# Patient Record
Sex: Male | Born: 1955
Health system: Southern US, Community
[De-identification: ages and names within clinical notes are randomized; demographics above are authoritative.]

## PROBLEM LIST (undated history)

## (undated) DIAGNOSIS — M1990 Unspecified osteoarthritis, unspecified site: Secondary | ICD-10-CM

## (undated) DIAGNOSIS — E7800 Pure hypercholesterolemia, unspecified: Secondary | ICD-10-CM

## (undated) DIAGNOSIS — F419 Anxiety disorder, unspecified: Secondary | ICD-10-CM

## (undated) DIAGNOSIS — K219 Gastro-esophageal reflux disease without esophagitis: Secondary | ICD-10-CM

## (undated) DIAGNOSIS — F32A Depression, unspecified: Secondary | ICD-10-CM

## (undated) DIAGNOSIS — F1910 Other psychoactive substance abuse, uncomplicated: Secondary | ICD-10-CM

## (undated) DIAGNOSIS — J189 Pneumonia, unspecified organism: Secondary | ICD-10-CM

## (undated) DIAGNOSIS — F329 Major depressive disorder, single episode, unspecified: Secondary | ICD-10-CM

## (undated) DIAGNOSIS — G709 Myoneural disorder, unspecified: Secondary | ICD-10-CM

## (undated) HISTORY — PX: COLONOSCOPY, ESOPHAGOGASTRODUODENOSCOPY (EGD) AND ESOPHAGEAL DILATION: SHX5781

## (undated) HISTORY — PX: TONSILLECTOMY: SUR1361

## (undated) HISTORY — PX: CARPAL TUNNEL RELEASE: SHX101

---

## 1991-01-28 HISTORY — PX: POSTERIOR LAMINECTOMY / DECOMPRESSION LUMBAR SPINE: SUR740

## 1996-01-28 HISTORY — PX: BACK SURGERY: SHX140

## 2006-01-13 ENCOUNTER — Emergency Department (HOSPITAL_COMMUNITY): Admission: EM | Admit: 2006-01-13 | Discharge: 2006-01-13 | Payer: Self-pay | Admitting: Emergency Medicine

## 2006-10-29 ENCOUNTER — Ambulatory Visit: Payer: Self-pay | Admitting: Family Medicine

## 2008-02-07 DIAGNOSIS — L858 Other specified epidermal thickening: Secondary | ICD-10-CM

## 2008-02-07 HISTORY — DX: Other specified epidermal thickening: L85.8

## 2008-09-06 ENCOUNTER — Ambulatory Visit: Payer: Self-pay | Admitting: Unknown Physician Specialty

## 2008-09-15 ENCOUNTER — Ambulatory Visit: Payer: Self-pay | Admitting: Unknown Physician Specialty

## 2008-09-19 ENCOUNTER — Inpatient Hospital Stay: Payer: Self-pay | Admitting: Unknown Physician Specialty

## 2009-03-22 ENCOUNTER — Ambulatory Visit: Payer: Self-pay | Admitting: Gastroenterology

## 2010-02-18 ENCOUNTER — Ambulatory Visit: Payer: Self-pay | Admitting: Gastroenterology

## 2010-02-21 LAB — PATHOLOGY REPORT

## 2014-02-27 ENCOUNTER — Ambulatory Visit: Payer: Self-pay | Admitting: Surgery

## 2014-02-27 LAB — URINALYSIS, COMPLETE
BILIRUBIN, UR: NEGATIVE
Bacteria: NONE SEEN
Blood: NEGATIVE
Glucose,UR: NEGATIVE mg/dL (ref 0–75)
KETONE: NEGATIVE
Leukocyte Esterase: NEGATIVE
Nitrite: NEGATIVE
PH: 6 (ref 4.5–8.0)
Protein: NEGATIVE
RBC, UR: NONE SEEN /HPF (ref 0–5)
SPECIFIC GRAVITY: 1.02 (ref 1.003–1.030)
WBC UR: <1 /HPF (ref 0–5)

## 2014-02-27 LAB — CBC
HCT: 50.4 % (ref 40.0–52.0)
HGB: 16.7 g/dL (ref 13.0–18.0)
MCH: 32.5 pg (ref 26.0–34.0)
MCHC: 33 g/dL (ref 32.0–36.0)
MCV: 98 fL (ref 80–100)
PLATELETS: 236 10*3/uL (ref 150–440)
RBC: 5.12 10*6/uL (ref 4.40–5.90)
RDW: 13.1 % (ref 11.5–14.5)
WBC: 7.8 10*3/uL (ref 3.8–10.6)

## 2014-02-27 LAB — BASIC METABOLIC PANEL
Anion Gap: 8 (ref 7–16)
BUN: 14 mg/dL (ref 7–18)
CALCIUM: 9.2 mg/dL (ref 8.5–10.1)
CO2: 24 mmol/L (ref 21–32)
Chloride: 109 mmol/L — ABNORMAL HIGH (ref 98–107)
Creatinine: 0.87 mg/dL (ref 0.60–1.30)
EGFR (African American): >60
Glucose: 86 mg/dL (ref 65–99)
Osmolality: 281 (ref 275–301)
POTASSIUM: 4.1 mmol/L (ref 3.5–5.1)
SODIUM: 141 mmol/L (ref 136–145)

## 2014-02-27 LAB — SEDIMENTATION RATE: Erythrocyte Sed Rate: 2 mm/hr (ref 0–20)

## 2014-02-27 LAB — PROTIME-INR
INR: 0.9
Prothrombin Time: 12.8 secs

## 2014-02-27 LAB — MRSA PCR SCREENING

## 2014-02-27 LAB — APTT: Activated PTT: 25.3 secs (ref 23.6–35.9)

## 2014-03-09 ENCOUNTER — Inpatient Hospital Stay: Payer: Self-pay | Admitting: Surgery

## 2014-05-28 NOTE — Op Note (Signed)
PATIENT NAME:  JAMAN, ARO MR#:  [id number] DATE OF BIRTH:  1955/05/08  DATE OF PROCEDURE:  03/09/2014  PREOPERATIVE DIAGNOSIS: Degenerative joint disease left knee, primarily involving the medial compartment.   POSTOPERATIVE DIAGNOSIS: Degenerative joint disease left knee, primarily involving the medial compartment.     PROCEDURE: Left unicondylar knee arthroplasty using an all-cemented Biomet Oxford system with a medium-sized femoral component, a "C" sized tibial tray, and a 4 mm meniscal bearing insert.   SURGEON:  Maryagnes Amos, MD.  ASSISTANT: April Berndt, NP.   ANESTHESIA: Spinal.   FINDINGS: As noted above.   COMPLICATIONS: None.   ESTIMATED BLOOD LOSS: Less than 25 mL   TOTAL FLUIDS: 1600 mL.   URINE OUTPUT: 250 mL   TOURNIQUET TIME: 80 minutes at 300 mmHg.   DRAINS: None.   CLOSURE: Staples.   BRIEF CLINICAL NOTE: The patient is a 59 year old male who has had history of gradually worsening medial-sided left knee pain. His symptoms have persisted despite medications, activity modification, et Karie Soda. His history and examination are consistent with moderate to severe degenerative joint disease, primarily involving the medial compartment. He presents at this time for a left partial knee replacement.   PROCEDURE: The patient was brought into the Operating Room. After adequate spinal anesthesia was obtained, he was lain in the supine position and a Foley catheter inserted. He was repositioned so that his right leg was placed in a somewhat flexed and abducted position in the yellowfin leg holder whereas the left leg was placed over the Biomet leg holder. The left lower extremity was prepped with ChloraPrep solution before being draped sterilely. Preoperative antibiotics were administered. The limb was exsanguinated with an Esmarch and the tourniquet inflated to 300 mmHg.  A standard anterior approach to the knee was made through an approximately 3.5 to 4 inches incision. The  incision was carried down through the subcutaneous tissues to expose the patella tendon. The medial flap was elevated sufficiently to expose the medial edge of the patellar tendon. The retinaculum was incised at this point and extended proximally along the medial border of the patella, leaving a 3 to 4 mm cuff of tissue attached to the patella.  A subtotal fat pad excision was performed before the anterior portion of the medial meniscus was removed.  The notch was assessed for osteophytes, but none were identified. The soft tissues were elevated off the anteromedial aspect of the proximal tibia to the level of the collateral ligaments. The femoral condyle was sized with a medium spoon which was felt to fit optimally. This was left in place and the external tibial guide positioned and the 2 devices connected with the appropriate connector. This was done in order to optimize alignment. The cutting block was secured with headless pins before the appropriate proximal tibial cut was made using appropriate reciprocating and oscillating saws. This piece was taken to the back table where it was measured and found to be optimally replicated by a "C" sized tibial component.   Attention was directed to the femoral side. The intramedullary canal was accessed through a 4 mm drill hole. The intramedullary guide was positioned. The femoral hole guide was set at 3 mm and then positioned appropriately.  The appropriate connector device was used to connect the guide to the intramedullary guide. The 2 drill holes were placed. The guides were removed and the posterior condylar cutting block positioned. The appropriate cut was made. At this point, the posterior portion of the medial meniscus was  removed. The #0 Spigot was inserted and initial bone milling performed. The femoral trial component was inserted and the flexion and extension gaps measured. In flexion, the gap measured 8 mm where as an extension it measured 5 mm therefore,  the #3 Spigot was selected and a secondary bone milling performed.  Repeat trial reduction demonstrated symmetric flexion and extension gaps with the 8 mm spacer. Numerous drill holes were placed into the distal femur to augment cement fixation.   Attention was redirected to the tibial side. The trial tibial component was positioned and temporarily secured using a headed pin.  The keel was created using the appropriate bi-bladed reciprocating saw and hoe.  The keeled tibial trial was then positioned to be sure that it seated properly.   The posterior capsular tissues, the medial gutter tissues, and the peri-incisional tissues all were injected with 30 mL of 0.5% Sensorcaine with epinephrine as well as with 20 mL of Exparel diluted out to 60 mL with normal saline in order to help with postoperative analgesia. The wound was copiously irrigated with sterile saline solution before the bony surfaces were packed with a dry lap sponge.  Meanwhile, cement was being mixed on the back table. When the cement was ready, the tibial tray was cemented in first.  The excess cement was removed using a Public house managerWoodson elevator. Next, the femoral component was impacted into place. Again, the excess cement was removed using a Public house managerWoodson elevator. The 5 mm spacer was inserted and the knee brought into near full extension while the cement hardened. Once the cement hardened, the spacer was removed, the 4 mm meniscal bearing insert was positioned and the knee was placed through a range of motion.  The meniscal bearing insert tracked well with flexion and extension and demonstrated no evidence toward subluxation or dislocation.  Therefore, the permanent 4 mm meniscal bearing insert was positioned. The knee again was placed through a range of motion with the findings as described above.   The wound was copiously irrigated with bacitracin saline solution via the jet lavage system before the retinacular layer was reapproximated using 0 Vicryl  interrupted sutures. The subcutaneous tissues were closed in two layers using 2-0 Vicryl interrupted sutures before the skin was closed using staples. A sterile occlusive dressing was applied to the knee before the leg was wrapped with an Ace wrap to accommodate a Polar Pack. The patient was then awakened and returned to the recovery room in satisfactory condition after tolerating the procedure well.        ____________________________ J. Derald MacleodJeffrey Saylor Sheckler, MD jjp:DT D: 03/09/2014 11:06:22 ET T: 03/09/2014 15:06:20 ET JOB#: [id number]  cc: Maryagnes AmosJ. Jeffrey Michayla Mcneil, MD, <Dictator> Maryagnes AmosJ. JEFFREY Pattiann Solanki MD ELECTRONICALLY SIGNED 03/14/2014 11:13

## 2015-01-28 HISTORY — PX: JOINT REPLACEMENT: SHX530

## 2017-05-15 ENCOUNTER — Other Ambulatory Visit: Payer: Self-pay

## 2017-05-15 ENCOUNTER — Emergency Department: Payer: No Typology Code available for payment source

## 2017-05-15 ENCOUNTER — Emergency Department
Admission: EM | Admit: 2017-05-15 | Discharge: 2017-05-15 | Disposition: A | Discharge status: Home / Self Care | Payer: No Typology Code available for payment source | Attending: Emergency Medicine | Admitting: Emergency Medicine

## 2017-05-15 ENCOUNTER — Encounter: Payer: Self-pay | Admitting: Emergency Medicine

## 2017-05-15 DIAGNOSIS — R109 Unspecified abdominal pain: Secondary | ICD-10-CM | POA: Diagnosis not present

## 2017-05-15 DIAGNOSIS — Y9389 Activity, other specified: Secondary | ICD-10-CM | POA: Insufficient documentation

## 2017-05-15 DIAGNOSIS — Z87891 Personal history of nicotine dependence: Secondary | ICD-10-CM | POA: Insufficient documentation

## 2017-05-15 DIAGNOSIS — T07XXXA Unspecified multiple injuries, initial encounter: Secondary | ICD-10-CM | POA: Diagnosis not present

## 2017-05-15 DIAGNOSIS — Y998 Other external cause status: Secondary | ICD-10-CM | POA: Insufficient documentation

## 2017-05-15 DIAGNOSIS — S8991XA Unspecified injury of right lower leg, initial encounter: Secondary | ICD-10-CM | POA: Diagnosis present

## 2017-05-15 DIAGNOSIS — S39012A Strain of muscle, fascia and tendon of lower back, initial encounter: Secondary | ICD-10-CM | POA: Diagnosis not present

## 2017-05-15 DIAGNOSIS — S161XXA Strain of muscle, fascia and tendon at neck level, initial encounter: Secondary | ICD-10-CM | POA: Diagnosis not present

## 2017-05-15 DIAGNOSIS — S82031A Displaced transverse fracture of right patella, initial encounter for closed fracture: Secondary | ICD-10-CM

## 2017-05-15 DIAGNOSIS — Y92410 Unspecified street and highway as the place of occurrence of the external cause: Secondary | ICD-10-CM | POA: Insufficient documentation

## 2017-05-15 LAB — BASIC METABOLIC PANEL
Anion gap: 5 (ref 5–15)
BUN: 18 mg/dL (ref 6–20)
CHLORIDE: 107 mmol/L (ref 101–111)
CO2: 26 mmol/L (ref 22–32)
Calcium: 9.4 mg/dL (ref 8.9–10.3)
Creatinine, Ser: 0.86 mg/dL (ref 0.61–1.24)
GFR calc non Af Amer: >60 mL/min (ref >60)
Glucose, Bld: 100 mg/dL — ABNORMAL HIGH (ref 65–99)
POTASSIUM: 3.9 mmol/L (ref 3.5–5.1)
Sodium: 138 mmol/L (ref 135–145)

## 2017-05-15 LAB — URINALYSIS, COMPLETE (UACMP) WITH MICROSCOPIC
Bilirubin Urine: NEGATIVE
GLUCOSE, UA: NEGATIVE mg/dL
KETONES UR: 20 mg/dL — AB
LEUKOCYTES UA: NEGATIVE
NITRITE: NEGATIVE
PH: 5 (ref 5.0–8.0)
Protein, ur: NEGATIVE mg/dL

## 2017-05-15 LAB — CBC WITH DIFFERENTIAL/PLATELET
BASOS ABS: 0 10*3/uL (ref 0–0.1)
BASOS PCT: 0 %
Eosinophils Absolute: 0.2 10*3/uL (ref 0–0.7)
Eosinophils Relative: 2 %
HEMATOCRIT: 47.1 % (ref 40.0–52.0)
HEMOGLOBIN: 15.9 g/dL (ref 13.0–18.0)
Lymphocytes Relative: 14 %
Lymphs Abs: 2.3 10*3/uL (ref 1.0–3.6)
MCH: 32.7 pg (ref 26.0–34.0)
MCHC: 33.9 g/dL (ref 32.0–36.0)
MCV: 96.5 fL (ref 80.0–100.0)
MONOS PCT: 6 %
Monocytes Absolute: 1 10*3/uL (ref 0.2–1.0)
NEUTROS ABS: 12.5 10*3/uL — AB (ref 1.4–6.5)
NEUTROS PCT: 78 %
Platelets: 206 10*3/uL (ref 150–440)
RBC: 4.88 MIL/uL (ref 4.40–5.90)
RDW: 12.8 % (ref 11.5–14.5)
WBC: 16.1 10*3/uL — ABNORMAL HIGH (ref 3.8–10.6)

## 2017-05-15 LAB — TYPE AND SCREEN
ABO/RH(D): B POS
ANTIBODY SCREEN: NEGATIVE

## 2017-05-15 MED ORDER — HYDROCODONE-ACETAMINOPHEN 5-325 MG PO TABS: 1.0000 | ORAL_TABLET | ORAL | 0 refills | Status: AC | PRN

## 2017-05-15 MED ORDER — BACITRACIN ZINC 500 UNIT/GM EX OINT
1.0000 "application " | TOPICAL_OINTMENT | Freq: Once | CUTANEOUS | Status: AC
  Administered 2017-05-15: 1 via TOPICAL
  Filled 2017-05-15: qty 0.9

## 2017-05-15 MED ORDER — LORAZEPAM 0.5 MG PO TABS
0.5000 mg | ORAL_TABLET | Freq: Once | ORAL | Status: AC
  Administered 2017-05-15: 0.5 mg via ORAL
  Filled 2017-05-15: qty 1

## 2017-05-15 MED ORDER — SODIUM CHLORIDE 0.9 % IV BOLUS
1000.0000 mL | Freq: Once | INTRAVENOUS | Status: AC
  Administered 2017-05-15: 1000 mL via INTRAVENOUS

## 2017-05-15 MED ORDER — ACETAMINOPHEN 325 MG PO TABS
650.0000 mg | ORAL_TABLET | Freq: Once | ORAL | Status: AC
  Administered 2017-05-15: 650 mg via ORAL
  Filled 2017-05-15: qty 2

## 2017-05-15 MED ORDER — IOPAMIDOL (ISOVUE-370) INJECTION 76%
125.0000 mL | Freq: Once | INTRAVENOUS | Status: AC | PRN
  Administered 2017-05-15: 125 mL via INTRAVENOUS
  Filled 2017-05-15: qty 125

## 2017-05-15 MED ORDER — HYDROCODONE-ACETAMINOPHEN 5-325 MG PO TABS
1.0000 | ORAL_TABLET | Freq: Once | ORAL | Status: AC
  Administered 2017-05-15: 1 via ORAL
  Filled 2017-05-15: qty 1

## 2017-05-15 NOTE — ED Notes (Signed)
Patient transported to CT 

## 2017-05-15 NOTE — Discharge Instructions (Signed)
Please await a call from Dr. Eliane DecreePatel's office on Monday. If you have not heard from them by the afternoon, call.  Return to the ER for symptoms that change or worsen or for new concerns if unable to schedule an appointment.

## 2017-05-15 NOTE — ED Notes (Signed)
Last had Tdap on 06/27/2008, per pt.

## 2017-05-15 NOTE — ED Provider Notes (Signed)
-----------------------------------------   5:41 PM on 05/15/2017 -----------------------------------------  EKG reviewed and interpreted by myself shows normal sinus rhythm at 79 bpm, narrow QRS, normal axis, normal intervals, no concerning ST changes.  Normal EKG.   Minna AntisPaduchowski, Eryck Negron, MD 05/15/17 1742
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
## 2017-06-27 DIAGNOSIS — M1990 Unspecified osteoarthritis, unspecified site: Secondary | ICD-10-CM

## 2017-06-27 HISTORY — DX: Unspecified osteoarthritis, unspecified site: M19.90

## 2017-07-10 ENCOUNTER — Ambulatory Visit
Admission: RE | Admit: 2017-07-10 | Discharge: 2017-07-10 | Disposition: A | Discharge status: Home / Self Care | Payer: Medicare Other | Source: Ambulatory Visit | Attending: Surgery | Admitting: Surgery

## 2017-07-10 ENCOUNTER — Other Ambulatory Visit: Payer: Self-pay

## 2017-07-10 ENCOUNTER — Encounter
Admission: RE | Admit: 2017-07-10 | Discharge: 2017-07-10 | Disposition: A | Discharge status: Home / Self Care | Payer: Medicare Other | Source: Ambulatory Visit | Attending: Surgery | Admitting: Surgery

## 2017-07-10 DIAGNOSIS — Z0183 Encounter for blood typing: Secondary | ICD-10-CM | POA: Diagnosis not present

## 2017-07-10 DIAGNOSIS — Z01818 Encounter for other preprocedural examination: Secondary | ICD-10-CM | POA: Insufficient documentation

## 2017-07-10 DIAGNOSIS — M1711 Unilateral primary osteoarthritis, right knee: Secondary | ICD-10-CM | POA: Insufficient documentation

## 2017-07-10 DIAGNOSIS — Z01812 Encounter for preprocedural laboratory examination: Secondary | ICD-10-CM | POA: Diagnosis not present

## 2017-07-10 HISTORY — DX: Major depressive disorder, single episode, unspecified: F32.9

## 2017-07-10 HISTORY — DX: Anxiety disorder, unspecified: F41.9

## 2017-07-10 HISTORY — DX: Pure hypercholesterolemia, unspecified: E78.00

## 2017-07-10 HISTORY — DX: Unspecified osteoarthritis, unspecified site: M19.90

## 2017-07-10 HISTORY — DX: Depression, unspecified: F32.A

## 2017-07-10 HISTORY — DX: Myoneural disorder, unspecified: G70.9

## 2017-07-10 LAB — TYPE AND SCREEN
ABO/RH(D): B POS
Antibody Screen: NEGATIVE

## 2017-07-10 LAB — SURGICAL PCR SCREEN
MRSA, PCR: NEGATIVE
Staphylococcus aureus: POSITIVE — AB

## 2017-07-10 NOTE — Patient Instructions (Signed)
Your procedure is scheduled on: Thursday, July 16, 2017  Report to THE SECOND FLOOR OF THE MEDICAL MALL      DO NOT STOP ON THE FIRST FLOOR TO REGISTER.  To find out your arrival time please call [phone] between 1PM - 3PM on Wednesday, June 19,2019  Remember: Instructions that are not followed completely may result in serious medical risk,  up to and including death, or upon the discretion of your surgeon and anesthesiologist  your surgery may need to be rescheduled.     _X__ 1. Do not eat food after midnight the night before your procedure.                 No gum chewing or hard candies. NOTHING SOLID IN YOUR MOUTH AFTER MIDNIGHT.                      NO SOUPS/BROTH OR ANYTHING WITH SOLIDS IN IT.                  You may drink clear liquids up to 2 hours before you are scheduled to arrive for your surgery-                   DO not drink clear liquids within 2 hours of the start of your surgery.                  Clear Liquids include:  water, apple juice without pulp, clear carbohydrate                 drink such as Clearfast of Gatorade, Black Coffee or Tea (Do not add                 anything to coffee or tea).  __X__2.  On the morning of surgery brush your teeth with toothpaste and water,                      You may rinse your mouth with mouthwash if you wish.                              Do not swallow an  toothpaste of mouthwash.     _X__ 3.  No Alcohol for 24 hours before or after surgery.   _X__ 4.  Do Not Smoke or use e-cigarettes For 24 Hours Prior to Your Surgery.                 Do not use any chewable tobacco products for at least 6 hours prior to                 surgery.  ____  5.  Bring all medications with you on the day of surgery if instructed.   ____  6.  Notify your doctor if there is any change in your medical condition      (cold, fever, infections).     Do not wear jewelry, make-up, hairpins, clips or nail polish. Do not wear  lotions, powders, or perfumes. You may wear deodorant. Do not shave 48 hours prior to surgery. Men may shave face and neck. Do not bring valuables to the hospital.    [facility] is not responsible for any belongings or valuables.  Contacts, dentures or bridgework may not be worn into surgery. Leave your suitcase in the car. After surgery it may be brought to  your room. For patients admitted to the hospital, discharge time is determined by your treatment team.   Patients discharged the day of surgery will not be allowed to drive home.   Please read over the following fact sheets that you were given:   PREPARING FOR SURGERY  ____ Take these medicines the morning of surgery with A SIP OF WATER:    1. WELLBUTRIN  2. ZYRTEC  3. ATIVAN  4. NASACORT  5.  6.  ____ Fleet Enema (as directed)   _X___ Use CHG Soap as directed  ____ Use inhalers on the day of surgery  ____ Stop ALL ASPIRIN PRODUCTS TODAY  _X__ Stop Anti-inflammatories TODAY              THIS INCLUDES IBUPROFEN / MOTRIN / ADVIL / ALEVE / MOBIC   ___X_ Stop supplements until after surgery.                THIS INCLUDES FOLIC ACID / GARLIC / GLUCOSAMINE CHONDROINTIN / FISH OIL / VITAMIN E  ____ Bring C-Pap to the hospital.   CONTINUE TAKING VITAMIN D3, COLACE AND CHLORTREMITON AS USUAL BUT DO NOT TAKE ON DAY OF SURGERY  CONTINUE TAKING ZOCOR AS USUAL AT NIGHT.  USE NORCO AS NEEDED.  TYLENOL IS AVAILABLE AT ANY TIME.

## 2017-07-11 NOTE — Pre-Procedure Instructions (Signed)
Positive staph aureus results sent to Dr. Joice LoftsPoggi for review, asked if wanted any treatment for this?

## 2017-07-15 MED ORDER — VANCOMYCIN HCL IN DEXTROSE 1-5 GM/200ML-% IV SOLN
1000.0000 mg | Freq: Once | INTRAVENOUS | Status: AC
  Administered 2017-07-16: 1000 mg via INTRAVENOUS

## 2017-07-16 ENCOUNTER — Ambulatory Visit: Payer: Medicare Other | Admitting: Anesthesiology

## 2017-07-16 ENCOUNTER — Encounter
Admission: RE | Disposition: A | Discharge status: Home / Self Care | Payer: Self-pay | Source: Ambulatory Visit | Attending: Surgery

## 2017-07-16 ENCOUNTER — Ambulatory Visit
Admission: RE | Admit: 2017-07-16 | Discharge: 2017-07-17 | Disposition: A | Discharge status: Home / Self Care | Payer: Medicare Other | Source: Ambulatory Visit | Attending: Surgery | Admitting: Surgery

## 2017-07-16 ENCOUNTER — Ambulatory Visit: Payer: Medicare Other

## 2017-07-16 ENCOUNTER — Encounter: Payer: Self-pay | Admitting: *Deleted

## 2017-07-16 ENCOUNTER — Other Ambulatory Visit: Payer: Self-pay

## 2017-07-16 DIAGNOSIS — Z823 Family history of stroke: Secondary | ICD-10-CM | POA: Diagnosis not present

## 2017-07-16 DIAGNOSIS — M1711 Unilateral primary osteoarthritis, right knee: Secondary | ICD-10-CM | POA: Diagnosis not present

## 2017-07-16 DIAGNOSIS — Z87891 Personal history of nicotine dependence: Secondary | ICD-10-CM | POA: Diagnosis not present

## 2017-07-16 DIAGNOSIS — F419 Anxiety disorder, unspecified: Secondary | ICD-10-CM | POA: Diagnosis not present

## 2017-07-16 DIAGNOSIS — Z791 Long term (current) use of non-steroidal anti-inflammatories (NSAID): Secondary | ICD-10-CM | POA: Insufficient documentation

## 2017-07-16 DIAGNOSIS — Z79899 Other long term (current) drug therapy: Secondary | ICD-10-CM | POA: Diagnosis not present

## 2017-07-16 DIAGNOSIS — Z8249 Family history of ischemic heart disease and other diseases of the circulatory system: Secondary | ICD-10-CM | POA: Insufficient documentation

## 2017-07-16 DIAGNOSIS — Z96651 Presence of right artificial knee joint: Secondary | ICD-10-CM

## 2017-07-16 DIAGNOSIS — E7800 Pure hypercholesterolemia, unspecified: Secondary | ICD-10-CM | POA: Diagnosis not present

## 2017-07-16 DIAGNOSIS — F329 Major depressive disorder, single episode, unspecified: Secondary | ICD-10-CM | POA: Diagnosis not present

## 2017-07-16 DIAGNOSIS — G709 Myoneural disorder, unspecified: Secondary | ICD-10-CM | POA: Diagnosis not present

## 2017-07-16 HISTORY — PX: PARTIAL KNEE ARTHROPLASTY: SHX2174

## 2017-07-16 SURGERY — ARTHROPLASTY, KNEE, UNICOMPARTMENTAL
Anesthesia: Spinal | Laterality: Right

## 2017-07-16 MED ORDER — KETOROLAC TROMETHAMINE 30 MG/ML IJ SOLN
30.0000 mg | Freq: Once | INTRAMUSCULAR | Status: AC
  Administered 2017-07-16: 30 mg via INTRAVENOUS

## 2017-07-16 MED ORDER — KETOROLAC TROMETHAMINE 30 MG/ML IJ SOLN
INTRAMUSCULAR | Status: AC
  Administered 2017-07-16: 30 mg via INTRAVENOUS
  Filled 2017-07-16: qty 1

## 2017-07-16 MED ORDER — TRANEXAMIC ACID 1000 MG/10ML IV SOLN
INTRAVENOUS | Status: DC | PRN
  Administered 2017-07-16: 1000 mg via TOPICAL

## 2017-07-16 MED ORDER — ACETAMINOPHEN 325 MG PO TABS
325.0000 mg | ORAL_TABLET | Freq: Four times a day (QID) | ORAL | Status: DC | PRN
  Administered 2017-07-17: 650 mg via ORAL
  Filled 2017-07-16: qty 2

## 2017-07-16 MED ORDER — ACETAMINOPHEN 500 MG PO TABS
1000.0000 mg | ORAL_TABLET | Freq: Four times a day (QID) | ORAL | Status: AC
  Administered 2017-07-16 – 2017-07-17 (×3): 1000 mg via ORAL
  Filled 2017-07-16 (×4): qty 2

## 2017-07-16 MED ORDER — BISACODYL 10 MG RE SUPP: 10.0000 mg | Freq: Every day | RECTAL | Status: DC | PRN

## 2017-07-16 MED ORDER — FAMOTIDINE 20 MG PO TABS
20.0000 mg | ORAL_TABLET | Freq: Once | ORAL | Status: AC
  Administered 2017-07-16: 20 mg via ORAL

## 2017-07-16 MED ORDER — TRAMADOL HCL 50 MG PO TABS
50.0000 mg | ORAL_TABLET | Freq: Four times a day (QID) | ORAL | Status: DC | PRN
  Administered 2017-07-17: 50 mg via ORAL
  Filled 2017-07-16: qty 1

## 2017-07-16 MED ORDER — MAGNESIUM HYDROXIDE 400 MG/5ML PO SUSP
30.0000 mL | Freq: Every day | ORAL | Status: DC | PRN
  Administered 2017-07-17: 30 mL via ORAL
  Filled 2017-07-16: qty 30

## 2017-07-16 MED ORDER — DIPHENHYDRAMINE HCL 12.5 MG/5ML PO ELIX: 12.5000 mg | ORAL_SOLUTION | ORAL | Status: DC | PRN

## 2017-07-16 MED ORDER — LACTATED RINGERS IV SOLN
INTRAVENOUS | Status: DC
  Administered 2017-07-16 (×2): via INTRAVENOUS

## 2017-07-16 MED ORDER — SODIUM CHLORIDE 0.9 % IV SOLN
INTRAVENOUS | Status: DC
  Administered 2017-07-16: 15:00:00 via INTRAVENOUS

## 2017-07-16 MED ORDER — PROPOFOL 500 MG/50ML IV EMUL
INTRAVENOUS | Status: DC | PRN
  Administered 2017-07-16: 70 ug/kg/min via INTRAVENOUS

## 2017-07-16 MED ORDER — LORAZEPAM 0.5 MG PO TABS
0.2500 mg | ORAL_TABLET | Freq: Three times a day (TID) | ORAL | Status: DC
  Administered 2017-07-16 – 2017-07-17 (×3): 0.25 mg via ORAL
  Filled 2017-07-16 (×3): qty 1

## 2017-07-16 MED ORDER — MIDAZOLAM HCL 5 MG/5ML IJ SOLN
INTRAMUSCULAR | Status: DC | PRN
  Administered 2017-07-16: 1 mg via INTRAVENOUS

## 2017-07-16 MED ORDER — LIDOCAINE HCL (PF) 2 % IJ SOLN
INTRAMUSCULAR | Status: AC
  Filled 2017-07-16: qty 10

## 2017-07-16 MED ORDER — FENTANYL CITRATE (PF) 100 MCG/2ML IJ SOLN
INTRAMUSCULAR | Status: AC
  Filled 2017-07-16: qty 2

## 2017-07-16 MED ORDER — OXYCODONE HCL 5 MG PO TABS
5.0000 mg | ORAL_TABLET | ORAL | Status: DC | PRN
  Administered 2017-07-16 – 2017-07-17 (×4): 10 mg via ORAL
  Filled 2017-07-16 (×4): qty 2

## 2017-07-16 MED ORDER — DIPHENHYDRAMINE HCL 25 MG PO CAPS: 25.0000 mg | ORAL_CAPSULE | Freq: Four times a day (QID) | ORAL | Status: DC | PRN

## 2017-07-16 MED ORDER — HYDROMORPHONE HCL 1 MG/ML IJ SOLN: 0.5000 mg | INTRAMUSCULAR | Status: DC | PRN

## 2017-07-16 MED ORDER — MIDAZOLAM HCL 2 MG/2ML IJ SOLN
INTRAMUSCULAR | Status: AC
  Filled 2017-07-16: qty 2

## 2017-07-16 MED ORDER — SODIUM CHLORIDE 0.9 % IV SOLN
INTRAVENOUS | Status: DC | PRN
  Administered 2017-07-16: 60 mL

## 2017-07-16 MED ORDER — PROMETHAZINE HCL 25 MG/ML IJ SOLN: 6.2500 mg | INTRAMUSCULAR | Status: DC | PRN

## 2017-07-16 MED ORDER — TRIAMCINOLONE ACETONIDE 55 MCG/ACT NA AERO: 2.0000 | INHALATION_SPRAY | Freq: Every day | NASAL | Status: DC

## 2017-07-16 MED ORDER — FENTANYL CITRATE (PF) 100 MCG/2ML IJ SOLN
INTRAMUSCULAR | Status: DC | PRN
  Administered 2017-07-16: 100 ug via INTRAVENOUS

## 2017-07-16 MED ORDER — GLUCOSAMINE-CHONDROITIN 500-400 MG PO CAPS: ORAL_CAPSULE | Freq: Every day | ORAL | Status: DC

## 2017-07-16 MED ORDER — METOCLOPRAMIDE HCL 10 MG PO TABS: 5.0000 mg | ORAL_TABLET | Freq: Three times a day (TID) | ORAL | Status: DC | PRN

## 2017-07-16 MED ORDER — OMEGA-3-ACID ETHYL ESTERS 1 G PO CAPS
1000.0000 mg | ORAL_CAPSULE | Freq: Every day | ORAL | Status: DC
  Filled 2017-07-16: qty 1

## 2017-07-16 MED ORDER — METOCLOPRAMIDE HCL 5 MG/ML IJ SOLN: 5.0000 mg | Freq: Three times a day (TID) | INTRAMUSCULAR | Status: DC | PRN

## 2017-07-16 MED ORDER — ENOXAPARIN SODIUM 40 MG/0.4ML ~~LOC~~ SOLN
40.0000 mg | SUBCUTANEOUS | Status: DC
  Administered 2017-07-17: 40 mg via SUBCUTANEOUS
  Filled 2017-07-16: qty 0.4

## 2017-07-16 MED ORDER — PROPOFOL 10 MG/ML IV BOLUS
INTRAVENOUS | Status: DC | PRN
  Administered 2017-07-16 (×2): 25 mg via INTRAVENOUS

## 2017-07-16 MED ORDER — BUPIVACAINE-EPINEPHRINE (PF) 0.5% -1:200000 IJ SOLN
INTRAMUSCULAR | Status: DC | PRN
  Administered 2017-07-16: 30 mL via PERINEURAL

## 2017-07-16 MED ORDER — KETAMINE HCL 50 MG/ML IJ SOLN
INTRAMUSCULAR | Status: DC | PRN
  Administered 2017-07-16: 30 mg via INTRAMUSCULAR

## 2017-07-16 MED ORDER — CEFAZOLIN SODIUM-DEXTROSE 2-4 GM/100ML-% IV SOLN
2.0000 g | Freq: Four times a day (QID) | INTRAVENOUS | Status: AC
  Administered 2017-07-16 – 2017-07-17 (×3): 2 g via INTRAVENOUS
  Filled 2017-07-16 (×3): qty 100

## 2017-07-16 MED ORDER — LORATADINE 10 MG PO TABS
10.0000 mg | ORAL_TABLET | Freq: Every day | ORAL | Status: DC
  Administered 2017-07-17: 10 mg via ORAL
  Filled 2017-07-16: qty 1

## 2017-07-16 MED ORDER — SIMVASTATIN 20 MG PO TABS
40.0000 mg | ORAL_TABLET | Freq: Every day | ORAL | Status: DC
  Administered 2017-07-16: 40 mg via ORAL
  Filled 2017-07-16: qty 2

## 2017-07-16 MED ORDER — KETOROLAC TROMETHAMINE 15 MG/ML IJ SOLN
15.0000 mg | Freq: Four times a day (QID) | INTRAMUSCULAR | Status: AC
  Administered 2017-07-16 – 2017-07-17 (×3): 15 mg via INTRAVENOUS
  Filled 2017-07-16 (×3): qty 1

## 2017-07-16 MED ORDER — FENTANYL CITRATE (PF) 100 MCG/2ML IJ SOLN: 25.0000 ug | INTRAMUSCULAR | Status: DC | PRN

## 2017-07-16 MED ORDER — BUPROPION HCL ER (SR) 150 MG PO TB12
150.0000 mg | ORAL_TABLET | Freq: Two times a day (BID) | ORAL | Status: DC
  Administered 2017-07-16 – 2017-07-17 (×2): 150 mg via ORAL
  Filled 2017-07-16 (×3): qty 1

## 2017-07-16 MED ORDER — VITAMIN D 1000 UNITS PO TABS
2000.0000 [IU] | ORAL_TABLET | Freq: Every day | ORAL | Status: DC
  Filled 2017-07-16: qty 2

## 2017-07-16 MED ORDER — FLEET ENEMA 7-19 GM/118ML RE ENEM: 1.0000 | ENEMA | Freq: Once | RECTAL | Status: DC | PRN

## 2017-07-16 MED ORDER — NEOMYCIN-POLYMYXIN B GU 40-200000 IR SOLN
Status: DC | PRN
  Administered 2017-07-16: 16 mL

## 2017-07-16 MED ORDER — DOCUSATE SODIUM 100 MG PO CAPS
100.0000 mg | ORAL_CAPSULE | Freq: Two times a day (BID) | ORAL | Status: DC
  Administered 2017-07-16 – 2017-07-17 (×2): 100 mg via ORAL
  Filled 2017-07-16 (×2): qty 1

## 2017-07-16 MED ORDER — GLYCOPYRROLATE 0.2 MG/ML IJ SOLN
INTRAMUSCULAR | Status: AC
  Filled 2017-07-16: qty 1

## 2017-07-16 MED ORDER — VANCOMYCIN HCL IN DEXTROSE 1-5 GM/200ML-% IV SOLN
1000.0000 mg | Freq: Two times a day (BID) | INTRAVENOUS | Status: AC
  Administered 2017-07-16: 1000 mg via INTRAVENOUS
  Filled 2017-07-16 (×2): qty 200

## 2017-07-16 MED ORDER — VITAMIN E 180 MG (400 UNIT) PO CAPS
400.0000 [IU] | ORAL_CAPSULE | Freq: Every day | ORAL | Status: DC
  Filled 2017-07-16 (×2): qty 1

## 2017-07-16 MED ORDER — FOLIC ACID 1 MG PO TABS
1.0000 mg | ORAL_TABLET | Freq: Every day | ORAL | Status: DC
  Filled 2017-07-16: qty 1

## 2017-07-16 MED ORDER — PANTOPRAZOLE SODIUM 40 MG PO TBEC
40.0000 mg | DELAYED_RELEASE_TABLET | Freq: Every day | ORAL | Status: DC
  Filled 2017-07-16: qty 1

## 2017-07-16 MED ORDER — SODIUM CHLORIDE 0.9 % IV SOLN
INTRAVENOUS | Status: DC | PRN
  Administered 2017-07-16: 30 ug/min via INTRAVENOUS
  Administered 2017-07-16: 20 ug/min via INTRAVENOUS

## 2017-07-16 MED ORDER — PROPOFOL 500 MG/50ML IV EMUL
INTRAVENOUS | Status: AC
  Filled 2017-07-16: qty 50

## 2017-07-16 MED ORDER — GARLIC 1000 MG PO CAPS
1000.0000 mg | ORAL_CAPSULE | Freq: Every day | ORAL | Status: DC
Start: 2017-07-16 — End: 2017-07-16

## 2017-07-16 MED ORDER — ONDANSETRON HCL 4 MG PO TABS: 4.0000 mg | ORAL_TABLET | Freq: Four times a day (QID) | ORAL | Status: DC | PRN

## 2017-07-16 MED ORDER — GLYCOPYRROLATE 0.2 MG/ML IJ SOLN
INTRAMUSCULAR | Status: DC | PRN
  Administered 2017-07-16: 0.2 mg via INTRAVENOUS

## 2017-07-16 MED ORDER — ONDANSETRON HCL 4 MG/2ML IJ SOLN: 4.0000 mg | Freq: Four times a day (QID) | INTRAMUSCULAR | Status: DC | PRN

## 2017-07-16 SURGICAL SUPPLY — 60 items
BANDAGE ACE 6X5 VEL STRL LF (GAUZE/BANDAGES/DRESSINGS) ×3 IMPLANT
BEARING MENISCAL TIBIAL 5 SM R (Orthopedic Implant) ×3 IMPLANT
CANISTER SUCT 1200ML W/VALVE (MISCELLANEOUS) ×3 IMPLANT
CANISTER SUCT 3000ML PPV (MISCELLANEOUS) ×3 IMPLANT
CEMENT BONE R 1X40 (Cement) ×3 IMPLANT
CEMENT VACUUM MIXING SYSTEM (MISCELLANEOUS) ×3 IMPLANT
CHLORAPREP W/TINT 26ML (MISCELLANEOUS) ×6 IMPLANT
COOLER POLAR GLACIER W/PUMP (MISCELLANEOUS) ×3 IMPLANT
COVER MAYO STAND STRL (DRAPES) ×3 IMPLANT
CUFF TOURN 24 STER (MISCELLANEOUS) IMPLANT
CUFF TOURN 30 STER DUAL PORT (MISCELLANEOUS) ×3 IMPLANT
DRAPE C-ARM XRAY 36X54 (DRAPES) IMPLANT
DRSG OPSITE POSTOP 4X12 (GAUZE/BANDAGES/DRESSINGS) IMPLANT
DRSG OPSITE POSTOP 4X14 (GAUZE/BANDAGES/DRESSINGS) IMPLANT
DRSG OPSITE POSTOP 4X6 (GAUZE/BANDAGES/DRESSINGS) ×3 IMPLANT
ELECT CAUTERY BLADE 6.4 (BLADE) ×3 IMPLANT
ELECT REM PT RETURN 9FT ADLT (ELECTROSURGICAL) ×3
ELECTRODE REM PT RTRN 9FT ADLT (ELECTROSURGICAL) ×1 IMPLANT
GAUZE PETRO XEROFOAM 1X8 (MISCELLANEOUS) IMPLANT
GAUZE SPONGE 4X4 12PLY STRL (GAUZE/BANDAGES/DRESSINGS) IMPLANT
GLOVE BIO SURGEON STRL SZ7.5 (GLOVE) ×12 IMPLANT
GLOVE BIO SURGEON STRL SZ8 (GLOVE) ×12 IMPLANT
GLOVE BIOGEL PI IND STRL 8 (GLOVE) ×1 IMPLANT
GLOVE BIOGEL PI INDICATOR 8 (GLOVE) ×2
GLOVE INDICATOR 8.0 STRL GRN (GLOVE) ×3 IMPLANT
GOWN STRL REUS W/ TWL LRG LVL3 (GOWN DISPOSABLE) ×1 IMPLANT
GOWN STRL REUS W/ TWL XL LVL3 (GOWN DISPOSABLE) ×1 IMPLANT
GOWN STRL REUS W/TWL LRG LVL3 (GOWN DISPOSABLE) ×2
GOWN STRL REUS W/TWL XL LVL3 (GOWN DISPOSABLE) ×2
HOLDER FOLEY CATH W/STRAP (MISCELLANEOUS) ×3 IMPLANT
HOOD PEEL AWAY FLYTE STAYCOOL (MISCELLANEOUS) ×9 IMPLANT
KIT TURNOVER KIT A (KITS) ×3 IMPLANT
MAT BLUE FLOOR 46X72 FLO (MISCELLANEOUS) ×3 IMPLANT
NDL SAFETY ECLIPSE 18X1.5 (NEEDLE) ×1 IMPLANT
NEEDLE HYPO 18GX1.5 SHARP (NEEDLE) ×2
NEEDLE SPNL 20GX3.5 QUINCKE YW (NEEDLE) ×3 IMPLANT
NS IRRIG 1000ML POUR BTL (IV SOLUTION) ×3 IMPLANT
PACK BLADE SAW RECIP 70 3 PT (BLADE) ×6 IMPLANT
PACK TOTAL KNEE (MISCELLANEOUS) ×3 IMPLANT
PAD WRAPON POLAR KNEE (MISCELLANEOUS) ×1 IMPLANT
PEG FEMORAL PEGGED STRL SM (Knees) ×3 IMPLANT
PULSAVAC PLUS IRRIG FAN TIP (DISPOSABLE) ×3
SOL .9 NS 3000ML IRR  AL (IV SOLUTION) ×2
SOL .9 NS 3000ML IRR UROMATIC (IV SOLUTION) ×1 IMPLANT
SPONGE XRAY 4X4 16PLY STRL (MISCELLANEOUS) IMPLANT
STAPLER SKIN PROX 35W (STAPLE) ×3 IMPLANT
STRAP SAFETY 5IN WIDE (MISCELLANEOUS) ×3 IMPLANT
SUCTION FRAZIER HANDLE 10FR (MISCELLANEOUS) ×2
SUCTION TUBE FRAZIER 10FR DISP (MISCELLANEOUS) ×1 IMPLANT
SUT VIC AB 0 CT1 36 (SUTURE) ×3 IMPLANT
SUT VIC AB 2-0 CT1 27 (SUTURE) ×8
SUT VIC AB 2-0 CT1 TAPERPNT 27 (SUTURE) ×4 IMPLANT
SYR 10ML LL (SYRINGE) ×3 IMPLANT
SYR 20CC LL (SYRINGE) ×3 IMPLANT
SYR 30ML LL (SYRINGE) ×9 IMPLANT
TAPE TRANSPORE STRL 2 31045 (GAUZE/BANDAGES/DRESSINGS) ×3 IMPLANT
TIP FAN IRRIG PULSAVAC PLUS (DISPOSABLE) ×1 IMPLANT
TRAY FOLEY MTR SLVR 16FR STAT (SET/KITS/TRAYS/PACK) ×3 IMPLANT
TRAY TIBIAL OXFORD SZ C RT (Joint) ×3 IMPLANT
WRAPON POLAR PAD KNEE (MISCELLANEOUS) ×3

## 2017-07-16 NOTE — Progress Notes (Signed)
PT Cancellation Note  Patient Details Name: Jared Kapurarl H Ciszewski Jr. MRN: [id number] DOB: Oct 04, 1955   Cancelled Treatment:    Reason Eval/Treat Not Completed: Other (comment). Consult received and chart reviewed. Pt currently lacking full sensation in R LE. Unable to dorsiflex ankle or perform SLR. Not appropriate for OOB mobility at this time. Will re-attempt next date.   Ether Goebel 07/16/2017, 3:45 PM Elizabeth PalauStephanie Hadeel Hillebrand, PT, DPT [phone]

## 2017-07-16 NOTE — Progress Notes (Signed)
ADMISSION NOTE:  Pt admitted to room 157 from PACU Pt a&o X4. No complaints of pain. Surgical dressing clean dry and intact. Family at bedside, Bed in lowest position, call bell in reach and bed alarm on.

## 2017-07-16 NOTE — Discharge Instructions (Signed)

## 2017-07-16 NOTE — Anesthesia Procedure Notes (Signed)
Spinal  Patient location during procedure: OR Start time: 07/16/2017 11:03 AM End time: 07/16/2017 11:06 AM Staffing Anesthesiologist: Martha Clan, MD Resident/CRNA: Bernardo Heater, CRNA Performed: resident/CRNA  Preanesthetic Checklist Completed: patient identified, site marked, surgical consent, pre-op evaluation, timeout performed, IV checked, risks and benefits discussed and monitors and equipment checked Spinal Block Patient position: sitting Prep: Betadine Patient monitoring: heart rate, continuous pulse ox, blood pressure and cardiac monitor Approach: midline Location: L3-4 Injection technique: single-shot Needle Needle type: Whitacre and Introducer  Needle gauge: 24 G Needle length: 9 cm Assessment Sensory level: T10 Additional Notes Negative paresthesia. Negative blood return. Positive free-flowing CSF. Expiration date of kit checked and confirmed. Patient tolerated procedure well, without complications.

## 2017-07-16 NOTE — Op Note (Signed)
07/16/2017  1:02 PM  Patient:   Jared KapurEarl H Patel Jr.  Pre-Op Diagnosis:   Osteoarthritis of medial compartment, right knee.  Post-Op Diagnosis:   Same  Procedure:   Right unicondylar knee arthroplasty.  Surgeon:   Maryagnes AmosJ. Jeffrey Delante Karapetyan, MD  Assistant:   Horris LatinoLance McGhee, PA-C  Anesthesia:   Spinal  Findings:   As above.  Complications:   None  EBL:   5 cc  Fluids:   1000 cc crystalloid  UOP:   300 cc  TT:   75 minutes at 300 mmHg  Drains:   None  Closure:   Staples  Implants:   All-cemented Biomet Oxford system with a Small femoral component, a "C" sized tibial tray, and a 5 mm meniscal bearing insert.  Brief Clinical Note:   The patient is a 62 year old male with a long history of gradually worsening medial sided right knee pain.  His symptoms have progressed despite medications, activity modification, etc.  His history and examination are consistent with degenerative joint disease, primarily involving the medial compartment.  The patient has previously undergone a left partial knee replacement from which she has done quite well.  He presents at this time for a right partial knee replacement.  Procedure:   The patient was brought into the operating room and a spinal placed by the anesthesiologist. The patient was lain in the supine position and a Foley catheter inserted. The patient was repositioned so that the non-surgical leg was placed in a flexed and abducted position in the yellow fin leg holder while the surgical extremity was placed over the Biomet leg holder. The right lower extremity was prepped with ChloraPrep solution before being draped sterilely. Preoperative antibiotics were administered. After performing a timeout to verify the appropriate surgical site, the limb was exsanguinated with an Esmarch and the tourniquet inflated to 300 mmHg. A standard anterior approach to the knee was made through an approximately 3.5-4 inch incision. The incision was carried down through the  subcutaneous tissues to expose the superficial retinaculum. This was split the length the incision and medial flap elevated sufficiently to expose the medial retinaculum. This was incised along the medial border of the patella tendon and extended proximally along the medial border of the patella, leaving a 3-4 mm cuff of tissue. The soft tissues were elevated off the anteromedial aspect of the proximal tibia. The anterior portion of the meniscus was removed after performing a subtotal excision of the infrapatellar fat pad. The anterior cruciate ligament was inspected and found to be in excellent condition. Osteophytes were removed from the inferior pole of the patella as well as from the notch using a quarter-inch osteotome. There were significant degenerative changes of both the femur and tibia on the medial side. The medial femoral condyle was sized using the small and medium sizers. It was felt that the small guide best optimized the contour of the femur. This was left in place and the external tibial guide positioned. The coupling device was used to connect the guide to the medial femoral condylar sizer to optimize appropriate orientation. Two guide pins were inserted into the cutting block before the coupling device and sizer were removed. The appropriate tibial cut was made using the oscillating and reciprocating saws. The piece was removed in its entirety and taken to the back table where it was sized and found to be optimally replicated by a "C" sized component. The 9 mm spacer was inserted to verify that sufficient bone had been removed.  Attention was directed to femoral side. The intramedullary canal was accessed through a 4 mm drill hole. The intramedullary guide was positioned before the guide for the femoral condylar holes was positioned. The appropriate coupling device connected this guide to the intramedullary guide before both drill holes were created in the distal aspect of the medial femoral  condyle. The devices were removed and the posterior condylar cutting block inserted. The appropriate cut was made using the reciprocating saw and this piece removed. The #0 spigot was inserted and the initial bone milling performed. A trial femoral component was inserted and both the flexion and extension gaps measured. In flexion, the gap measured 8 mm whereas in extension, it measured 7 mm. Therefore, the #1 spigot was selected and the secondary bone milling performed. Repeat sizing demonstrated symmetric flexion and extension gaps. The bone was removed from the postero-medial and postero-lateral aspects of the femoral condyle, as well as from the beneath the collar of the spigot. Bone also was removed from the anterior portion of the femur so as to minimize any potential impingement with the meniscal bearing insert. The trial components removed and several drill holes placed into the distal femoral condyle to further augment cement fixation.  Attention was redirected to the tibial side. The "C" sized tibial tray was positioned and temporarily secured using the appropriate spiked nail. The keel was created using the bi-bladed reciprocating saw and hoe. The keeled "C" sized trial tibial tray was inserted to be sure that it seated properly. At this point, a total of 20 cc of Exparel diluted out to 60 cc with normal saline and 30 cc of 0.5% Sensorcaine was injected in and around the posterior and medial capsular tissues, as well as the peri-incisional tissues to help with postoperative pain control.  The bony surfaces were prepared for cementing by irrigating them thoroughly with bacitracin saline solution using the jet lavage system before packing them with a dry Ray-Tec sponge. Meanwhile, cement was being mixed on the back table. When the cement was ready, the tibial tray was cemented in first. The excess cement was removed using a Public house manager after impacting it into place. Next, the femoral component was  impacted into place. Again the excess cement was removed using a Public house manager. The 6 mm spacer was inserted and the knee brought into near full extension while the cement hardened. Once the cement hardened, the spacer was removed and the 5 mm meniscal bearing insert was trialed. This demonstrated excellent tracking while the knee was placed through a range of motion, and showed no evidence towards subluxation or dislocation. In addition, it did not fit too tightly. Therefore, the permanent 5 mm meniscal bearing insert was snapped into position after verifying that no cement had been retained posteriorly. Again the knee was placed through a range of motion with the findings as described above.  The wound was copiously irrigated with bacitracin saline solution via the jet lavage system before the retinacular layer was reapproximated using #0 Vicryl interrupted sutures. At this point, 1 g of transexemic acid in 10 cc of normal saline was injected intra-articularly. The subcutaneous tissues were closed in two layers using 2-0 Vicryl interrupted sutures before the skin was closed using staples. A sterile occlusive dressing was applied to the knee before the patient was awakened. The patient was transferred back to his/her hospital bed and returned to the recovery room in satisfactory condition after tolerating the procedure well. A Polar Care device was applied to the knee  as well.

## 2017-07-16 NOTE — H&P (Signed)
Paper H&P to be scanned into permanent record. H&P reviewed and patient re-examined. No changes. 

## 2017-07-16 NOTE — Anesthesia Preprocedure Evaluation (Addendum)
Anesthesia Evaluation  Patient identified by MRN, date of birth, ID band Patient awake    Reviewed: Allergy & Precautions, H&P , NPO status , Patient's Chart, lab work & pertinent test results, reviewed documented beta blocker date and time   History of Anesthesia Complications Negative for: history of anesthetic complications  Airway Mallampati: II  TM Distance: >3 FB Neck ROM: full    Dental  (+) Dental Advidsory Given   Pulmonary neg pulmonary ROS, former smoker,           Cardiovascular Exercise Tolerance: Good negative cardio ROS       Neuro/Psych PSYCHIATRIC DISORDERS Anxiety Depression negative neurological ROS     GI/Hepatic negative GI ROS, Neg liver ROS,   Endo/Other  negative endocrine ROS  Renal/GU negative Renal ROS  negative genitourinary   Musculoskeletal   Abdominal   Peds  Hematology negative hematology ROS (+)   Anesthesia Other Findings Past Medical History: No date: Anxiety 06/2017: Arthritis     Comment:  osteoarthritis of lower extremities No date: Depression No date: Hypercholesteremia No date: Neuromuscular disorder (HCC)     Comment:  neuralgia, neuritis and radiculitis   Reproductive/Obstetrics negative OB ROS                            Anesthesia Physical Anesthesia Plan  ASA: II  Anesthesia Plan: Spinal   Post-op Pain Management:    Induction: Intravenous  PONV Risk Score and Plan: 2 and Propofol infusion  Airway Management Planned: Nasal Cannula  Additional Equipment:   Intra-op Plan:   Post-operative Plan:   Informed Consent: I have reviewed the patients History and Physical, chart, labs and discussed the procedure including the risks, benefits and alternatives for the proposed anesthesia with the patient or authorized representative who has indicated his/her understanding and acceptance.   Dental Advisory Given  Plan Discussed with:  Anesthesiologist, CRNA and Surgeon  Anesthesia Plan Comments:       Anesthesia Quick Evaluation

## 2017-07-16 NOTE — Transfer of Care (Signed)
Immediate Anesthesia Transfer of Care Note  Patient: Jared Kapurarl H Griffiths Jr.  Procedure(s) Performed: UNICOMPARTMENTAL KNEE (Right )  Patient Location: PACU  Anesthesia Type:Spinal  Level of Consciousness: awake and patient cooperative  Airway & Oxygen Therapy: Patient Spontanous Breathing and Patient connected to nasal cannula oxygen  Post-op Assessment: Report given to RN and Post -op Vital signs reviewed and stable  Post vital signs: Reviewed and stable  Last Vitals:  Vitals Value Taken Time  BP 113/72 07/16/2017  1:21 PM  Temp    Pulse 73 07/16/2017  1:22 PM  Resp 18 07/16/2017  1:22 PM  SpO2 98 % 07/16/2017  1:22 PM  Vitals shown include unvalidated device data.  Last Pain:  Vitals:   07/16/17 0854  TempSrc: Tympanic  PainSc: 5          Complications: No apparent anesthesia complications

## 2017-07-16 NOTE — Anesthesia Post-op Follow-up Note (Signed)
Anesthesia QCDR form completed.        

## 2017-07-16 NOTE — Discharge Summary (Signed)
Physician Discharge Summary  Patient ID: Jared Boyd. MRN: [id number] DOB/AGE: 1955/12/09 62 y.o.  Admit date: 07/16/2017 Discharge date: 07/17/2017  Admission Diagnoses:  PRIMARY OSTEOARTHRITIS OF RIGHT KNEE  Discharge Diagnoses: Patient Active Problem List   Diagnosis Date Noted  . Status post right partial knee replacement 07/16/2017    Past Medical History:  Diagnosis Date  . Anxiety   . Arthritis 06/2017   osteoarthritis of lower extremities  . Depression   . Hypercholesteremia   . Neuromuscular disorder (HCC)    neuralgia, neuritis and radiculitis     Transfusion: None.   Consultants (if any):   Discharged Condition: Improved  Hospital Course: Jared Boyd. is an 62 y.o. male who was admitted 07/16/2017 with a diagnosis of primary osteoarthritis of the medial compartment of the right knee and went to the operating room on 07/16/2017 and underwent the above named procedures.    Surgeries: Procedure(s): UNICOMPARTMENTAL KNEE on 07/16/2017 Patient tolerated the surgery well. Taken to PACU where she was stabilized and then transferred to the orthopedic floor.  Started on Lovenox 40mg  q 24 hrs. Foot pumps applied bilaterally at 80 mm. Heels elevated on bed with rolled towels. No evidence of DVT. Negative Homan. Physical therapy started on day #1 for gait training and transfer. OT started day #1 for ADL and assisted devices.  Patient's IV was removed on POD1, Foley was removed shortly following surgery.  Implants: All-cemented Biomet Oxford system with a Small femoral component, a "C" sized tibial tray, and a 5 mm meniscal bearing insert.  He was given perioperative antibiotics:  Anti-infectives (From admission, onward)   Start     Dose/Rate Route Frequency Ordered Stop   07/16/17 1800  vancomycin (VANCOCIN) IVPB 1000 mg/200 mL premix     1,000 mg 200 mL/hr over 60 Minutes Intravenous Every 12 hours 07/16/17 1125 07/16/17 2049   07/16/17 1600  ceFAZolin  (ANCEF) IVPB 2g/100 mL premix     2 g 200 mL/hr over 30 Minutes Intravenous Every 6 hours 07/16/17 1440 07/17/17 0541   07/15/17 2215  vancomycin (VANCOCIN) IVPB 1000 mg/200 mL premix     1,000 mg 200 mL/hr over 60 Minutes Intravenous  Once 07/15/17 2204 07/16/17 1900    .  He was given sequential compression devices, early ambulation, and Lovenox for DVT prophylaxis.  He benefited maximally from the hospital stay and there were no complications.    Recent vital signs:  Vitals:   07/17/17 0005 07/17/17 0500  BP: 96/60 (!) 93/58  Pulse: 68 69  Resp:    Temp:    SpO2: 95% 97%    Recent laboratory studies:  Lab Results  Component Value Date   HGB 15.3 07/17/2017   HGB 15.9 05/15/2017   HGB 16.7 02/27/2014   Lab Results  Component Value Date   WBC 12.0 (H) 07/17/2017   PLT 178 07/17/2017   Lab Results  Component Value Date   INR 0.9 02/27/2014   Lab Results  Component Value Date   NA 138 07/17/2017   K 4.4 07/17/2017   CL 107 07/17/2017   CO2 24 07/17/2017   BUN 15 07/17/2017   CREATININE 0.90 07/17/2017   GLUCOSE 97 07/17/2017    Discharge Medications:   Allergies as of 07/17/2017   No Known Allergies     Medication List    TAKE these medications   acetaminophen 500 MG tablet Commonly known as:  TYLENOL Take 1,000 mg by mouth 3 (three) times daily.  buPROPion 150 MG 12 hr tablet Commonly known as:  WELLBUTRIN SR Take 150 mg by mouth 2 (two) times daily. 0500 & 1200   cetirizine 10 MG tablet Commonly known as:  ZYRTEC Take 10 mg by mouth daily.   chlorpheniramine 4 MG tablet Commonly known as:  CHLOR-TRIMETON Take 4 mg by mouth 2 (two) times daily. NOON & NIGHT.   COSAMIN DS PO Take 2 tablets by mouth daily. (1500mg -1200mg )   docusate sodium 100 MG capsule Commonly known as:  COLACE Take 100 mg by mouth 3 (three) times daily.   enoxaparin 40 MG/0.4ML injection Commonly known as:  LOVENOX Inject 0.4 mLs (40 mg total) into the skin daily.    Fish Oil 1000 MG Caps Take 1,000 mg by mouth daily.   folic acid 1 MG tablet Commonly known as:  FOLVITE Take 1 mg by mouth daily.   Garlic 1000 MG Caps Take 1,000 mg by mouth daily.   HYDROcodone-acetaminophen 5-325 MG tablet Commonly known as:  NORCO/VICODIN Take 1 tablet by mouth every 4 (four) hours as needed for moderate pain. What changed:  reasons to take this   LORazepam 0.5 MG tablet Commonly known as:  ATIVAN Take 0.5 tablets by mouth 3 (three) times daily.   meloxicam 15 MG tablet Commonly known as:  MOBIC Take 15 mg by mouth at bedtime.   oxyCODONE 5 MG immediate release tablet Commonly known as:  Oxy IR/ROXICODONE Take 1-2 tablets (5-10 mg total) by mouth every 4 (four) hours as needed for moderate pain (pain score 4-6).   simvastatin 40 MG tablet Commonly known as:  ZOCOR Take 40 mg by mouth at bedtime.   triamcinolone 55 MCG/ACT Aero nasal inhaler Commonly known as:  NASACORT Place 2 sprays into the nose daily.   Vitamin D3 2000 units Tabs Take 2,000 Units by mouth daily.   vitamin E 400 UNIT capsule Take 400 Units by mouth daily.            Durable Medical Equipment  (From admission, onward)        Start     Ordered   07/16/17 1441  DME Bedside commode  Once    Question:  Patient needs a bedside commode to treat with the following condition  Answer:  Status post right partial knee replacement   07/16/17 1440   07/16/17 1441  DME Walker rolling  Once    Question:  Patient needs a walker to treat with the following condition  Answer:  Status post right partial knee replacement   07/16/17 1440   07/16/17 1441  DME 3 n 1  Once     07/16/17 1440      Diagnostic Studies: Dg Chest 2 View  Result Date: 07/10/2017 CLINICAL DATA:  Preop knee replacement. EXAM: CHEST - 2 VIEW COMPARISON:  10/29/2006 FINDINGS: Lungs are adequately inflated and otherwise clear. Cardiomediastinal silhouette is within normal. Minimal degenerative change of the  spine. Radiopaque material over a lower cervical spine disc space. IMPRESSION: No active cardiopulmonary disease. Electronically Signed   By: Elberta Fortis M.D.   On: 07/10/2017 11:09   Dg Knee Right Port  Result Date: 07/16/2017 CLINICAL DATA:  Partial knee replacement EXAM: PORTABLE RIGHT KNEE - 1-2 VIEW COMPARISON:  May 05, 2017 FINDINGS: Frontal and lateral views were obtained. There has been knee replacement medially with prosthetic components well-seated. There is a fracture of the inferior patella with slight displacement of fracture fragments in this area. This finding was present on previous  study with degree of displacement marginally increased at this time compared to prior study. No other fracture. No dislocation. There is a joint effusion fat fluid level in the suprapatellar region. There are skin staples anteriorly. There is soft tissue air medially, consistent with recent surgery. IMPRESSION: 1. Medial compartment knee replacement with prosthetic components well-seated. 2. Mildly displaced fracture inferior patella with slight increase in fracture fragment displacement inferiorly compared to preoperative study from April 2019. Fat-fluid level in the suprapatellar bursa. 3.  No new fracture.  No dislocation. Electronically Signed   By: Bretta BangWilliam  Woodruff III M.D.   On: 07/16/2017 14:13    Disposition: Plan is for discharge home later today following sessions with PT.    Follow-up Information    Anson OregonMcGhee, Lenford Beddow Lance, PA-C Follow up in 14 day(s).   Specialty:  Physician Assistant Why:  Mindi SlickerStaple Removal. Contact information: [address] [facility] [place] [id number] [phone]          Signed: Meriel PicaJames L Rut Betterton PA-C 07/17/2017, 8:06 AM

## 2017-07-16 NOTE — Progress Notes (Signed)
Assessment done. Awake in bed without distress. Medicated for pain per mar. Rt leg in cpm machine. Call bell in reach, instructed to call for needs.

## 2017-07-16 NOTE — NC FL2 (Signed)
[place] MEDICAID FL2 LEVEL OF CARE SCREENING TOOL     IDENTIFICATION  Patient Name: Jared Boyd. Birthdate: 10/29/1955 Sex: male Admission Date (Current Location): 07/16/2017  [place] and [place] Number:  Chiropodist and Address:  [facility], [address], [place], [place] [address]      Provider Number: [id number]  Attending Physician Name and Address:  Christena Flake, MD  Relative Name and Phone Number:       Current Level of Care: Hospital Recommended Level of Care: Skilled Nursing Facility Prior Approval Number:    Date Approved/Denied:   PASRR Number: [id number])  Discharge Plan: SNF    Current Diagnoses: Patient Active Problem List   Diagnosis Date Noted  . Status post right partial knee replacement 07/16/2017    Orientation RESPIRATION BLADDER Height & Weight     Self, Time, Situation, Place  Normal Continent Weight: 189 lb (85.7 kg) Height:  5\' 10"  (177.8 cm)  BEHAVIORAL SYMPTOMS/MOOD NEUROLOGICAL BOWEL NUTRITION STATUS      Continent Diet(Diet: Heart Healthy/ Carb Modified. )  AMBULATORY STATUS COMMUNICATION OF NEEDS Skin   Extensive Assist Verbally Surgical wounds(Incision: Right Knee. )                       Personal Care Assistance Level of Assistance  Bathing, Feeding, Dressing Bathing Assistance: Limited assistance Feeding assistance: Independent Dressing Assistance: Limited assistance     Functional Limitations Info  Sight, Hearing, Speech Sight Info: Adequate Hearing Info: Adequate Speech Info: Adequate    SPECIAL CARE FACTORS FREQUENCY  PT (By licensed PT), OT (By licensed OT)     PT Frequency: (5) OT Frequency: (5)            Contractures      Additional Factors Info  Code Status, Allergies Code Status Info: (Full Code. ) Allergies Info: (No Known Allergies. )           Current Medications (07/16/2017):  This is the current hospital active medication list Current  Facility-Administered Medications  Medication Dose Route Frequency Provider Last Rate Last Dose  . 0.9 %  sodium chloride infusion   Intravenous Continuous Poggi, Excell Seltzer, MD 75 mL/hr at 07/16/17 1451    . acetaminophen (TYLENOL) tablet 1,000 mg  1,000 mg Oral Q6H Poggi, Excell Seltzer, MD      . Melene Muller ON 07/17/2017] acetaminophen (TYLENOL) tablet 325-650 mg  325-650 mg Oral Q6H PRN Poggi, Excell Seltzer, MD      . bisacodyl (DULCOLAX) suppository 10 mg  10 mg Rectal Daily PRN Poggi, Excell Seltzer, MD      . buPROPion [facility] SR) 12 hr tablet 150 mg  150 mg Oral BID Poggi, Excell Seltzer, MD      . ceFAZolin (ANCEF) IVPB 2g/100 mL premix  2 g Intravenous Q6H Poggi, Excell Seltzer, MD      . cholecalciferol (VITAMIN D) tablet 2,000 Units  2,000 Units Oral Daily Poggi, Excell Seltzer, MD      . diphenhydrAMINE (BENADRYL) 12.5 MG/5ML elixir 12.5-25 mg  12.5-25 mg Oral Q4H PRN Poggi, Excell Seltzer, MD      . diphenhydrAMINE (BENADRYL) capsule 25 mg  25 mg Oral Q6H PRN Poggi, Excell Seltzer, MD      . docusate sodium (COLACE) capsule 100 mg  100 mg Oral BID Poggi, Excell Seltzer, MD      . Melene Muller ON 07/17/2017] enoxaparin (LOVENOX) injection 40 mg  40 mg Subcutaneous Q24H Poggi, John  J, MD      . folic acid (FOLVITE) tablet 1 mg  1 mg Oral Daily Poggi, Excell SeltzerJohn J, MD      . Glucosamine-Chondroitin 500-400 MG CAPS   Oral Daily Poggi, Excell SeltzerJohn J, MD      . HYDROmorphone (DILAUDID) injection 0.5-1 mg  0.5-1 mg Intravenous Q4H PRN Poggi, Excell SeltzerJohn J, MD      . ketorolac (TORADOL) 15 MG/ML injection 15 mg  15 mg Intravenous Q6H Poggi, Excell SeltzerJohn J, MD      . Melene Muller[START ON 07/17/2017] loratadine (CLARITIN) tablet 10 mg  10 mg Oral Daily Poggi, Excell SeltzerJohn J, MD      . LORazepam (ATIVAN) tablet 0.25 mg  0.25 mg Oral TID Poggi, Excell SeltzerJohn J, MD      . magnesium hydroxide (MILK OF MAGNESIA) suspension 30 mL  30 mL Oral Daily PRN Poggi, Excell SeltzerJohn J, MD      . metoCLOPramide (REGLAN) tablet 5-10 mg  5-10 mg Oral Q8H PRN Poggi, Excell SeltzerJohn J, MD       Or  . metoCLOPramide (REGLAN) injection 5-10 mg  5-10 mg Intravenous Q8H  PRN Poggi, Excell SeltzerJohn J, MD      . omega-3 acid ethyl esters (LOVAZA) capsule 1,000 mg  1,000 mg Oral Daily Poggi, Excell SeltzerJohn J, MD      . ondansetron (ZOFRAN) tablet 4 mg  4 mg Oral Q6H PRN Poggi, Excell SeltzerJohn J, MD       Or  . ondansetron (ZOFRAN) injection 4 mg  4 mg Intravenous Q6H PRN Poggi, Excell SeltzerJohn J, MD      . oxyCODONE (Oxy IR/ROXICODONE) immediate release tablet 5-10 mg  5-10 mg Oral Q4H PRN Poggi, Excell SeltzerJohn J, MD      . pantoprazole (PROTONIX) EC tablet 40 mg  40 mg Oral Daily Poggi, Excell SeltzerJohn J, MD      . simvastatin (ZOCOR) tablet 40 mg  40 mg Oral QHS Poggi, Excell SeltzerJohn J, MD      . sodium phosphate (FLEET) 7-19 GM/118ML enema 1 enema  1 enema Rectal Once PRN Poggi, Excell SeltzerJohn J, MD      . traMADol (ULTRAM) tablet 50 mg  50 mg Oral Q6H PRN Poggi, Excell SeltzerJohn J, MD      . triamcinolone (NASACORT) nasal inhaler 2 spray  2 spray Nasal Daily Poggi, Excell SeltzerJohn J, MD      . vancomycin (VANCOCIN) IVPB 1000 mg/200 mL premix  1,000 mg Intravenous Q12H Poggi, Excell SeltzerJohn J, MD      . vitamin E capsule 400 Units  400 Units Oral Daily Poggi, Excell SeltzerJohn J, MD         Discharge Medications: Please see discharge summary for a list of discharge medications.  Relevant Imaging Results:  Relevant Lab Results:   Additional Information (SSN: [id number])  Zehra Rucci, Darleen CrockerBailey M, LCSW

## 2017-07-17 ENCOUNTER — Encounter: Payer: Self-pay | Admitting: Surgery

## 2017-07-17 DIAGNOSIS — M1711 Unilateral primary osteoarthritis, right knee: Secondary | ICD-10-CM | POA: Diagnosis not present

## 2017-07-17 LAB — BASIC METABOLIC PANEL
Anion gap: 7 (ref 5–15)
BUN: 15 mg/dL (ref 6–20)
CHLORIDE: 107 mmol/L (ref 101–111)
CO2: 24 mmol/L (ref 22–32)
Calcium: 8.9 mg/dL (ref 8.9–10.3)
Creatinine, Ser: 0.9 mg/dL (ref 0.61–1.24)
GFR calc Af Amer: >60 mL/min (ref >60)
GFR calc non Af Amer: >60 mL/min (ref >60)
Glucose, Bld: 97 mg/dL (ref 65–99)
POTASSIUM: 4.4 mmol/L (ref 3.5–5.1)
SODIUM: 138 mmol/L (ref 135–145)

## 2017-07-17 LAB — CBC WITH DIFFERENTIAL/PLATELET
Basophils Absolute: 0.1 10*3/uL (ref 0–0.1)
Basophils Relative: 1 %
EOS ABS: 0.5 10*3/uL (ref 0–0.7)
Eosinophils Relative: 4 %
HEMATOCRIT: 44.7 % (ref 40.0–52.0)
HEMOGLOBIN: 15.3 g/dL (ref 13.0–18.0)
LYMPHS ABS: 2.8 10*3/uL (ref 1.0–3.6)
LYMPHS PCT: 23 %
MCH: 33 pg (ref 26.0–34.0)
MCHC: 34.2 g/dL (ref 32.0–36.0)
MCV: 96.6 fL (ref 80.0–100.0)
MONOS PCT: 11 %
Monocytes Absolute: 1.3 10*3/uL — ABNORMAL HIGH (ref 0.2–1.0)
NEUTROS ABS: 7.3 10*3/uL — AB (ref 1.4–6.5)
NEUTROS PCT: 61 %
Platelets: 178 10*3/uL (ref 150–440)
RBC: 4.63 MIL/uL (ref 4.40–5.90)
RDW: 12.7 % (ref 11.5–14.5)
WBC: 12 10*3/uL — AB (ref 3.8–10.6)

## 2017-07-17 MED ORDER — ENOXAPARIN SODIUM 40 MG/0.4ML ~~LOC~~ SOLN
40.0000 mg | SUBCUTANEOUS | 0 refills | Status: DC
Start: 2017-07-17 — End: 2021-09-26

## 2017-07-17 MED ORDER — OXYCODONE HCL 5 MG PO TABS: 5.0000 mg | ORAL_TABLET | ORAL | 0 refills | Status: DC | PRN

## 2017-07-17 NOTE — Progress Notes (Signed)
Clinical Social Worker (CSW) received SNF consult. PT is recommending home health. RN case manager aware of above. Please reconsult if future social work needs arise. CSW signing off.   Melik Blancett, LCSW [phone] 

## 2017-07-17 NOTE — Progress Notes (Signed)
  Subjective: 1 Day Post-Op Procedure(s) (LRB): UNICOMPARTMENTAL KNEE (Right) Patient reports pain as mild.   Patient is well, and has had no acute complaints or problems Plan is to go Home after hospital stay. Negative for chest pain and shortness of breath Fever: no Gastrointestinal:Negative for nausea and vomiting  Objective: Vital signs in last 24 hours: Temp:  [97 F (36.1 C)-97.6 F (36.4 C)] 97.6 F (36.4 C) (06/20 2003) Pulse Rate:  [57-75] 69 (06/21 0500) Resp:  [14-19] 18 (06/20 2003) BP: (93-130)/(57-97) 93/58 (06/21 0500) SpO2:  [95 %-100 %] 97 % (06/21 0500) Weight:  [85.7 kg (189 lb)] 85.7 kg (189 lb) (06/20 0854)  Intake/Output from previous day:  Intake/Output Summary (Last 24 hours) at 07/17/2017 0803 Last data filed at 07/17/2017 0541 Gross per 24 hour  Intake 2487.5 ml  Output 2355 ml  Net 132.5 ml    Intake/Output this shift: No intake/output data recorded.  Labs: Recent Labs    07/17/17 0335  HGB 15.3   Recent Labs    07/17/17 0335  WBC 12.0*  RBC 4.63  HCT 44.7  PLT 178   Recent Labs    07/17/17 0335  NA 138  K 4.4  CL 107  CO2 24  BUN 15  CREATININE 0.90  GLUCOSE 97  CALCIUM 8.9   No results for input(s): LABPT, INR in the last 72 hours.   EXAM General - Patient is Alert, Appropriate and Oriented Extremity - ABD soft Neurovascular intact Sensation intact distally Intact pulses distally Dorsiflexion/Plantar flexion intact Incision: dressing C/D/I No cellulitis present Dressing/Incision - clean, dry, no drainage Motor Function - intact, moving foot and toes well on exam.  Abdomen with normal BS this AM.  Past Medical History:  Diagnosis Date  . Anxiety   . Arthritis 06/2017   osteoarthritis of lower extremities  . Depression   . Hypercholesteremia   . Neuromuscular disorder (HCC)    neuralgia, neuritis and radiculitis    Assessment/Plan: 1 Day Post-Op Procedure(s) (LRB): UNICOMPARTMENTAL KNEE (Right) Active  Problems:   Status post right partial knee replacement  Estimated body mass index is 27.12 kg/m as calculated from the following:   Height as of this encounter: 5\' 10"  (1.778 m).   Weight as of this encounter: 85.7 kg (189 lb). Advance diet Up with therapy D/C IV fluids when tolerating po intake.  Labs reviewed this AM. Pt is passing gas without pain this AM. Up with therapy today. Plan will be for discharge home this afternoon.   DVT Prophylaxis - Lovenox, Foot Pumps and TED hose Weight-Bearing as tolerated to right leg  J. Horris LatinoLance McGhee, PA-C [facility] 07/17/2017, 8:03 AM

## 2017-07-17 NOTE — Progress Notes (Signed)
Physical Therapy Treatment Patient Details Name: Jared Boyd. MRN: [id number] DOB: [birth year] Today's Date: 07/17/2017    History of Present Illness pt arrived in hospital 07/16/17 after an elective R unicondylar knee replacement performed by Dr. Joice Lofts. Pt has a past medical history that includes previous L knee replacement 2 years ago, anxiety, depression, OA, and neuromuscular disorder.    PT Comments    Pt seen this afternoon and states that his knee is sore however he was able to tolerate being up in chair since end of last visit. Pt displays improvement noted through decreased fatigue and rest breaks, increased gait pattern and decreased amount of assistance needed throughout. Given and reviewed HEP packet, encouraged to perform BID until d/c by HHPT. Pt instructed in stair training and performs safely with CGA. PT believes at this time pt is safe to d/c. Pt could benefit from continued skilled therapy to improve deficits toward PLOF. D/c recommendation continues to be home with HHPT.   Follow Up Recommendations  Home health PT     Equipment Recommendations  None recommended by PT    Recommendations for Other Services       Precautions / Restrictions Precautions Precautions: Knee;Fall Precaution Booklet Issued: Yes (comment)(given and reviewed HEP packet) Restrictions Weight Bearing Restrictions: Yes RLE Weight Bearing: Weight bearing as tolerated Other Position/Activity Restrictions: activity as tolerated    Mobility  Bed Mobility Overal bed mobility: Independent Bed Mobility: Sit to Supine       Sit to supine: Independent   General bed mobility comments: pt independent for bed mobility and is able to perform safely in appropriate amount of time.  Transfers Overall transfer level: Needs assistance Equipment used: Rolling walker (2 wheeled) Transfers: Sit to/from Stand Sit to Stand: Min guard         General transfer comment: pt performs sit<>stand with  min guarding. Pt performs safely without any dizziness or feelings of being light headed.  Ambulation/Gait Ambulation/Gait assistance: Min guard Gait Distance (Feet): 100 Feet Assistive device: Rolling walker (2 wheeled) Gait Pattern/deviations: Step-through pattern     General Gait Details: pt amb 100' with much improved gait pattern compared to this morning. Pt requires RW and min guarding however is able to bear weight on R LE more appropriately allowing a step through pattern.   Stairs Stairs: Yes Stairs assistance: Min guard Stair Management: One rail Left Number of Stairs: 4 General stair comments: pt able to ascend and descend 4 stairs with min guard and rail on L. No AD used, pt displays no gross LOB. PT educated and demonstrates safe technique prior to pt performing. Pt states he feels comfortable entering and exiting his home enviornment   Wheelchair Mobility    Modified Rankin (Stroke Patients Only)       Balance                                            Cognition Arousal/Alertness: Awake/alert Behavior During Therapy: WFL for tasks assessed/performed Overall Cognitive Status: Within Functional Limits for tasks assessed                                        Exercises Other Exercises Other Exercises: pt instructed in supine and seated ther-ex including ankle pumps, quad sets, heel slides,  SLR, SAQ, seat marching, and LAQ x15. Pt required verbal cuing throughout for appropriate technique.    General Comments        Pertinent Vitals/Pain Pain Assessment: 0-10 Pain Score: 5  Pain Location: R knee Pain Descriptors / Indicators: Operative site guarding;Aching Pain Intervention(s): Monitored during session;Limited activity within patient's tolerance;Repositioned;Ice applied    Home Living                      Prior Function            PT Goals (current goals can now be found in the care plan section) Acute  Rehab PT Goals Patient Stated Goal: to have a better quality of life PT Goal Formulation: With patient Time For Goal Achievement: 07/31/17 Potential to Achieve Goals: Good Progress towards PT goals: Progressing toward goals    Frequency    BID      PT Plan Current plan remains appropriate    Co-evaluation              AM-PAC PT "6 Clicks" Daily Activity  Outcome Measure  Difficulty turning over in bed (including adjusting bedclothes, sheets and blankets)?: None Difficulty moving from lying on back to sitting on the side of the bed? : None Difficulty sitting down on and standing up from a chair with arms (e.g., wheelchair, bedside commode, etc,.)?: Unable Help needed moving to and from a bed to chair (including a wheelchair)?: A Little Help needed walking in hospital room?: A Little Help needed climbing 3-5 steps with a railing? : A Little 6 Click Score: 18    End of Session Equipment Utilized During Treatment: Gait belt Activity Tolerance: Patient tolerated treatment well Patient left: in chair;with call bell/phone within reach;with chair alarm set Nurse Communication: Other (comment)(pt appropriate for d/c) PT Visit Diagnosis: Unsteadiness on feet (R26.81);Muscle weakness (generalized) (M62.81);Difficulty in walking, not elsewhere classified (R26.2);Pain Pain - Right/Left: Right Pain - part of body: Knee     Time: 1325-1405 PT Time Calculation (min) (ACUTE ONLY): 40 min  Charges:                       G Codes:       Azayla Polo Elnora Morrisonhaffin, SPT    Xavius Spadafore 07/17/2017, 4:22 PM

## 2017-07-17 NOTE — Evaluation (Signed)
Physical Therapy Evaluation Patient Details Name: Jared H Onder Jr. MRN: [id number] DOB: 3Lilian Boyd/16/1957 Today's Date: 07/17/2017   History of Present Illness  pt arrived in hospital 07/16/17 after an elective R unicondylar knee replacement performed by Dr. Joice LoftsPoggi. Pt has a past medical history that includes previous L knee replacement 2 years ago, anxiety, depression, OA, and neuromuscular disorder.    Clinical Impression  Pt is a pleasant 62 year old male who was admitted for a R unicondylar knee replacement performed by Dr. Joice LoftsPoggi. Pt performs bed mobility, transfers, and ambulation with CGA and a RW. Pt demonstrates deficits with strength, ROM, and mobility. Pt demonstrates ability to perform 10 SLRs with independence, therefore does not require KI for mobility. Pt sensation WNL on B LE. Pt  able to perform AROM of all LE motions including extension if her toes. Pt states that pain was 3/10 at the onset of therapy. Pt amb 200' with CGA and a chair follow and several standing rest breaks. Pt AAROM of R knee 4-77 degrees. Pt was instructed verbally and through demonstration HEP. Pt tolerated treatment well. PT would benefit from continued skilled therapy to improve toward PLOF. PT will continue to work with pt BID during admission. At this time PT recommends d/c home with home health PT. Pt plans to attempt stair training at next session. This entire session was guided, instructed, and directly supervised by Elizabeth PalauStephanie Ray, DPT.    Follow Up Recommendations Home health PT    Equipment Recommendations  None recommended by PT    Recommendations for Other Services       Precautions / Restrictions Precautions Precautions: Knee;Fall Precaution Booklet Issued: No Restrictions Weight Bearing Restrictions: Yes RLE Weight Bearing: Weight bearing as tolerated Other Position/Activity Restrictions: activity as tolerated      Mobility  Bed Mobility Overal bed mobility: Needs Assistance Bed Mobility:  Supine to Sit     Supine to sit: Supervision     General bed mobility comments: pt requires supervision for bed mobility and is able to perform safely in appropriate amount of time.  Transfers Overall transfer level: Needs assistance Equipment used: Rolling walker (2 wheeled) Transfers: Sit to/from Stand Sit to Stand: Min guard         General transfer comment: pt performs sit<>stand with min guarding x2 with minimal cuing for hand placement. Pt performs safely without any dizziness or feelings of being light headed.  Ambulation/Gait Ambulation/Gait assistance: Min guard Gait Distance (Feet): 200 Feet Assistive device: Rolling walker (2 wheeled) Gait Pattern/deviations: Step-to pattern;Decreased step length - left     General Gait Details: pt amb 200' total with CGA, RW and chair follow. pt demonstrates step to gait pattern with decrease step length on L and decreases stance time on R, able to correct with verbal cuing. Pt requires several standing rest breaks throughout. Verbal cuing throughout for upright posture. Pt RW adjusted to appropriate height prior to beginning gait training,  Stairs            Wheelchair Mobility    Modified Rankin (Stroke Patients Only)       Balance Overall balance assessment: Needs assistance   Sitting balance-Leahy Scale: Good Sitting balance - Comments: pt sits EOB with feet flat on floor and no gross LOB     Standing balance-Leahy Scale: Fair Standing balance comment: Pt stands with B UE support on RW, no gross LOB standing or during gait training.  Pertinent Vitals/Pain Pain Assessment: 0-10 Pain Score: 3  Pain Location: R knee Pain Descriptors / Indicators: Operative site guarding;Aching Pain Intervention(s): Limited activity within patient's tolerance;Monitored during session;Premedicated before session;Ice applied    Home Living Family/patient expects to be discharged to:: Private  residence Living Arrangements: Spouse/significant other Available Help at Discharge: Family(wife 24/7 for 5 days upon arrival home) Type of Home: House Home Access: Stairs to enter Entrance Stairs-Rails: Left Entrance Stairs-Number of Steps: 2 Home Layout: One level Home Equipment: Walker - 2 wheels;Cane - single point      Prior Function Level of Independence: Independent with assistive device(s)(SPC)               Hand Dominance        Extremity/Trunk Assessment   Upper Extremity Assessment Upper Extremity Assessment: Overall WFL for tasks assessed    Lower Extremity Assessment Lower Extremity Assessment: RLE deficits/detail;LLE deficits/detail RLE Deficits / Details: unable to fully assess due pain, grossly at least 4/5  RLE Sensation: WNL LLE Deficits / Details: WNL strength and ROM. Grossly at least 4+/5 LLE Sensation: WNL       Communication   Communication: No difficulties  Cognition Arousal/Alertness: Awake/alert Behavior During Therapy: WFL for tasks assessed/performed Overall Cognitive Status: Within Functional Limits for tasks assessed                                        General Comments      Exercises Total Joint Exercises Goniometric ROM: AAROM of R knee 4-77 Other Exercises Other Exercises: pt instructed in supine and seated ther-ex including ankle pumps, quad sets, heel slides, SLR, SAQ, and LAQ x10. Pt required verbal cuing throughout for appropriate technique.   Assessment/Plan    PT Assessment Patient needs continued PT services  PT Problem List Decreased strength;Decreased range of motion;Decreased activity tolerance;Decreased balance;Decreased mobility;Decreased coordination;Decreased cognition;Decreased knowledge of use of DME;Pain       PT Treatment Interventions DME instruction;Gait training;Stair training;Functional mobility training;Therapeutic activities;Therapeutic exercise;Balance training    PT Goals  (Current goals can be found in the Care Plan section)  Acute Rehab PT Goals Patient Stated Goal: to have a better quality of life PT Goal Formulation: With patient Time For Goal Achievement: 07/31/17 Potential to Achieve Goals: Good    Frequency BID   Barriers to discharge        Co-evaluation               AM-PAC PT "6 Clicks" Daily Activity  Outcome Measure Difficulty turning over in bed (including adjusting bedclothes, sheets and blankets)?: Unable Difficulty moving from lying on back to sitting on the side of the bed? : Unable Difficulty sitting down on and standing up from a chair with arms (e.g., wheelchair, bedside commode, etc,.)?: Unable Help needed moving to and from a bed to chair (including a wheelchair)?: A Little Help needed walking in hospital room?: A Little Help needed climbing 3-5 steps with a railing? : A Little 6 Click Score: 12    End of Session Equipment Utilized During Treatment: Gait belt Activity Tolerance: Patient tolerated treatment well Patient left: in chair;with call bell/phone within reach;with chair alarm set   PT Visit Diagnosis: Unsteadiness on feet (R26.81);Muscle weakness (generalized) (M62.81);Difficulty in walking, not elsewhere classified (R26.2);Pain Pain - Right/Left: Right Pain - part of body: Knee    Time: 1022-1105 PT Time Calculation (min) (ACUTE ONLY): 43  min   Charges:   PT Evaluation $PT Eval Low Complexity: 1 Low PT Treatments $Gait Training: 8-22 mins $Therapeutic Exercise: 8-22 mins   PT G Codes:        Corin Tilly, SPT  Charie Pinkus 07/17/2017, 2:11 PM

## 2017-07-17 NOTE — Care Management Note (Signed)
Case Management Note  Patient Details  Name: Jared Boyd. MRN: [id number] Date of Birth: [birth year]  Subjective/Objective:  Met with patient at bedside to discuss discharge planning. Patient has a walker. He states he lives with his wife. Offered a list of home health agencies. Referral to [facility] for HHPT. Pharmacy: [facility]: [address]- Called lovenox 40 mg # 14 no refills.                   Action/Plan: [facility] for HHPT, Lovenox called in.  Expected Discharge Date:  07/17/17               Expected Discharge Plan:  [place]  In-House Referral:     Discharge planning Services  CM Consult  Post Acute Care Choice:  Home Health Choice offered to:  Patient  DME Arranged:    DME Agency:     HH Arranged:    [place] Agency:  [facility] (formerly [facility])  Status of Service:  Completed, signed off  If discussed at [employer], dates discussed:    Additional Comments:  Jolly Mango, RN 07/17/2017, 12:49 PM

## 2017-07-19 NOTE — Anesthesia Postprocedure Evaluation (Signed)
Anesthesia Post Note  Patient: Jared Kapurarl H Mankey Jr.  Procedure(s) Performed: UNICOMPARTMENTAL KNEE (Right )  Patient location during evaluation: PACU Anesthesia Type: Spinal Level of consciousness: awake and alert Pain management: pain level controlled Vital Signs Assessment: post-procedure vital signs reviewed and stable Respiratory status: spontaneous breathing and respiratory function stable Cardiovascular status: blood pressure returned to baseline and stable Postop Assessment: spinal receding Anesthetic complications: no     Last Vitals:  Vitals:   07/17/17 0500 07/17/17 1414  BP: (!) 93/58 121/78  Pulse: 69 84  Resp:  18  Temp:  36.7 C  SpO2: 97% 97%    Last Pain:  Vitals:   07/17/17 1529  TempSrc:   PainSc: 5                  Lenard SimmerAndrew Shenetta Schnackenberg

## 2017-07-20 ENCOUNTER — Other Ambulatory Visit
Admission: RE | Admit: 2017-07-20 | Discharge: 2017-07-20 | Disposition: A | Discharge status: Home / Self Care | Payer: Medicare Other | Source: Ambulatory Visit | Attending: Surgery | Admitting: Surgery

## 2017-07-20 DIAGNOSIS — Z96651 Presence of right artificial knee joint: Secondary | ICD-10-CM | POA: Insufficient documentation

## 2017-07-20 LAB — SYNOVIAL CELL COUNT + DIFF, W/ CRYSTALS
Crystals, Fluid: NONE SEEN
Eosinophils-Synovial: 1 %
LYMPHOCYTES-SYNOVIAL FLD: 3 %
Monocyte-Macrophage-Synovial Fluid: 0 %
NEUTROPHIL, SYNOVIAL: 96 %
Other Cells-SYN: 0
WBC, Synovial: 17602 /mm3 — ABNORMAL HIGH (ref 0–200)

## 2017-07-23 ENCOUNTER — Other Ambulatory Visit: Payer: Self-pay

## 2017-07-23 ENCOUNTER — Inpatient Hospital Stay
Admission: RE | Admit: 2017-07-23 | Discharge: 2017-07-25 | DRG: 488 | Disposition: A | Discharge status: Home Health | Payer: Medicare Other | Attending: Surgery | Admitting: Surgery

## 2017-07-23 ENCOUNTER — Inpatient Hospital Stay: Payer: Medicare Other | Admitting: Certified Registered"

## 2017-07-23 ENCOUNTER — Encounter
Admission: RE | Disposition: A | Discharge status: Home Health | Payer: Self-pay | Source: Home / Self Care | Attending: Surgery

## 2017-07-23 ENCOUNTER — Encounter: Payer: Self-pay | Admitting: Certified Registered"

## 2017-07-23 DIAGNOSIS — E7800 Pure hypercholesterolemia, unspecified: Secondary | ICD-10-CM | POA: Diagnosis present

## 2017-07-23 DIAGNOSIS — Z87891 Personal history of nicotine dependence: Secondary | ICD-10-CM | POA: Diagnosis not present

## 2017-07-23 DIAGNOSIS — M009 Pyogenic arthritis, unspecified: Principal | ICD-10-CM | POA: Diagnosis present

## 2017-07-23 DIAGNOSIS — Z791 Long term (current) use of non-steroidal anti-inflammatories (NSAID): Secondary | ICD-10-CM

## 2017-07-23 DIAGNOSIS — Z79899 Other long term (current) drug therapy: Secondary | ICD-10-CM

## 2017-07-23 DIAGNOSIS — M25561 Pain in right knee: Secondary | ICD-10-CM | POA: Diagnosis present

## 2017-07-23 DIAGNOSIS — M25061 Hemarthrosis, right knee: Secondary | ICD-10-CM | POA: Diagnosis present

## 2017-07-23 HISTORY — PX: KNEE ARTHROTOMY: SHX5881

## 2017-07-23 LAB — CBC
HCT: 48.5 % (ref 40.0–52.0)
Hemoglobin: 16.8 g/dL (ref 13.0–18.0)
MCH: 33.2 pg (ref 26.0–34.0)
MCHC: 34.7 g/dL (ref 32.0–36.0)
MCV: 95.8 fL (ref 80.0–100.0)
PLATELETS: 315 10*3/uL (ref 150–440)
RBC: 5.07 MIL/uL (ref 4.40–5.90)
RDW: 13.1 % (ref 11.5–14.5)
WBC: 11.7 10*3/uL — ABNORMAL HIGH (ref 3.8–10.6)

## 2017-07-23 LAB — BASIC METABOLIC PANEL
Anion gap: 12 (ref 5–15)
BUN: 15 mg/dL (ref 8–23)
CALCIUM: 9.8 mg/dL (ref 8.9–10.3)
CHLORIDE: 101 mmol/L (ref 98–111)
CO2: 24 mmol/L (ref 22–32)
Creatinine, Ser: 0.71 mg/dL (ref 0.61–1.24)
GFR calc Af Amer: >60 mL/min (ref >60)
Glucose, Bld: 104 mg/dL — ABNORMAL HIGH (ref 70–99)
Potassium: 3.7 mmol/L (ref 3.5–5.1)
Sodium: 137 mmol/L (ref 135–145)

## 2017-07-23 LAB — URINALYSIS, ROUTINE W REFLEX MICROSCOPIC
BILIRUBIN URINE: NEGATIVE
Glucose, UA: NEGATIVE mg/dL
HGB URINE DIPSTICK: NEGATIVE
KETONES UR: NEGATIVE mg/dL
Leukocytes, UA: NEGATIVE
NITRITE: NEGATIVE
PH: 5 (ref 5.0–8.0)
Protein, ur: NEGATIVE mg/dL
SPECIFIC GRAVITY, URINE: 1.02 (ref 1.005–1.030)

## 2017-07-23 LAB — TYPE AND SCREEN
ABO/RH(D): B POS
ANTIBODY SCREEN: NEGATIVE

## 2017-07-23 LAB — PROTIME-INR
INR: 1.07
PROTHROMBIN TIME: 13.8 s (ref 11.4–15.2)

## 2017-07-23 LAB — MRSA PCR SCREENING: MRSA by PCR: NEGATIVE

## 2017-07-23 SURGERY — ARTHROTOMY, KNEE
Anesthesia: Spinal | Laterality: Right

## 2017-07-23 MED ORDER — SIMVASTATIN 20 MG PO TABS
40.0000 mg | ORAL_TABLET | Freq: Every day | ORAL | Status: DC
  Administered 2017-07-23 – 2017-07-24 (×2): 40 mg via ORAL
  Filled 2017-07-23 (×2): qty 2

## 2017-07-23 MED ORDER — ENOXAPARIN SODIUM 40 MG/0.4ML ~~LOC~~ SOLN
40.0000 mg | SUBCUTANEOUS | Status: DC
  Administered 2017-07-24 – 2017-07-25 (×2): 40 mg via SUBCUTANEOUS
  Filled 2017-07-23 (×2): qty 0.4

## 2017-07-23 MED ORDER — BUPIVACAINE HCL (PF) 0.5 % IJ SOLN
INTRAMUSCULAR | Status: AC
  Filled 2017-07-23: qty 10

## 2017-07-23 MED ORDER — MAGNESIUM HYDROXIDE 400 MG/5ML PO SUSP
30.0000 mL | Freq: Every day | ORAL | Status: DC | PRN
  Administered 2017-07-24: 30 mL via ORAL
  Filled 2017-07-23: qty 30

## 2017-07-23 MED ORDER — KETOROLAC TROMETHAMINE 30 MG/ML IJ SOLN
INTRAMUSCULAR | Status: AC
  Filled 2017-07-23: qty 1

## 2017-07-23 MED ORDER — DEXAMETHASONE SODIUM PHOSPHATE 10 MG/ML IJ SOLN
INTRAMUSCULAR | Status: DC | PRN
  Administered 2017-07-23: 5 mg via INTRAVENOUS

## 2017-07-23 MED ORDER — FOLIC ACID 1 MG PO TABS
1.0000 mg | ORAL_TABLET | Freq: Every day | ORAL | Status: DC
  Administered 2017-07-24 – 2017-07-25 (×2): 1 mg via ORAL
  Filled 2017-07-23 (×2): qty 1

## 2017-07-23 MED ORDER — OMEGA-3-ACID ETHYL ESTERS 1 G PO CAPS
1.0000 g | ORAL_CAPSULE | Freq: Every evening | ORAL | Status: DC
  Filled 2017-07-23 (×2): qty 1

## 2017-07-23 MED ORDER — VITAMIN D3 25 MCG (1000 UNIT) PO TABS
2000.0000 [IU] | ORAL_TABLET | Freq: Every day | ORAL | Status: DC
  Administered 2017-07-24 – 2017-07-25 (×2): 2000 [IU] via ORAL
  Filled 2017-07-23 (×5): qty 2

## 2017-07-23 MED ORDER — GLYCOPYRROLATE 0.2 MG/ML IJ SOLN
INTRAMUSCULAR | Status: AC
  Filled 2017-07-23: qty 1

## 2017-07-23 MED ORDER — DEXAMETHASONE SODIUM PHOSPHATE 10 MG/ML IJ SOLN
INTRAMUSCULAR | Status: AC
  Filled 2017-07-23: qty 1

## 2017-07-23 MED ORDER — KETAMINE HCL 10 MG/ML IJ SOLN
INTRAMUSCULAR | Status: DC | PRN
  Administered 2017-07-23 (×2): 25 mg via INTRAVENOUS

## 2017-07-23 MED ORDER — GARLIC 1000 MG PO CAPS: 1000.0000 mg | ORAL_CAPSULE | Freq: Every day | ORAL | Status: DC

## 2017-07-23 MED ORDER — VANCOMYCIN HCL IN DEXTROSE 1-5 GM/200ML-% IV SOLN
1000.0000 mg | Freq: Three times a day (TID) | INTRAVENOUS | Status: DC
  Administered 2017-07-23 – 2017-07-25 (×5): 1000 mg via INTRAVENOUS
  Filled 2017-07-23 (×8): qty 200

## 2017-07-23 MED ORDER — DIPHENHYDRAMINE HCL 12.5 MG/5ML PO ELIX: 12.5000 mg | ORAL_SOLUTION | ORAL | Status: DC | PRN

## 2017-07-23 MED ORDER — ONDANSETRON HCL 4 MG/2ML IJ SOLN: INTRAMUSCULAR | Status: DC | PRN

## 2017-07-23 MED ORDER — ONDANSETRON HCL 4 MG/2ML IJ SOLN: 4.0000 mg | Freq: Four times a day (QID) | INTRAMUSCULAR | Status: DC | PRN

## 2017-07-23 MED ORDER — LIDOCAINE HCL (CARDIAC) PF 100 MG/5ML IV SOSY
PREFILLED_SYRINGE | INTRAVENOUS | Status: DC | PRN
  Administered 2017-07-23: 50 mg via INTRAVENOUS

## 2017-07-23 MED ORDER — BUPIVACAINE HCL (PF) 0.5 % IJ SOLN
INTRAMUSCULAR | Status: DC | PRN
  Administered 2017-07-23: 3 mL

## 2017-07-23 MED ORDER — NEOMYCIN-POLYMYXIN B GU 40-200000 IR SOLN
Status: DC | PRN
  Administered 2017-07-23: 14 mL

## 2017-07-23 MED ORDER — VANCOMYCIN HCL IN DEXTROSE 1-5 GM/200ML-% IV SOLN
INTRAVENOUS | Status: AC
  Administered 2017-07-23: 1000 mg
  Filled 2017-07-23: qty 200

## 2017-07-23 MED ORDER — HYDROMORPHONE HCL 1 MG/ML IJ SOLN: 0.5000 mg | INTRAMUSCULAR | Status: DC | PRN

## 2017-07-23 MED ORDER — FENTANYL CITRATE (PF) 100 MCG/2ML IJ SOLN
INTRAMUSCULAR | Status: AC
  Filled 2017-07-23: qty 2

## 2017-07-23 MED ORDER — METOCLOPRAMIDE HCL 5 MG/ML IJ SOLN: 5.0000 mg | Freq: Three times a day (TID) | INTRAMUSCULAR | Status: DC | PRN

## 2017-07-23 MED ORDER — GLUCOSAMINE-CHONDROITIN 500-400 MG PO CAPS: ORAL_CAPSULE | Freq: Every day | ORAL | Status: DC

## 2017-07-23 MED ORDER — VITAMIN E 180 MG (400 UNIT) PO CAPS
400.0000 [IU] | ORAL_CAPSULE | Freq: Every day | ORAL | Status: DC
  Administered 2017-07-24 – 2017-07-25 (×2): 400 [IU] via ORAL
  Filled 2017-07-23 (×2): qty 1

## 2017-07-23 MED ORDER — BUPROPION HCL ER (SR) 150 MG PO TB12
150.0000 mg | ORAL_TABLET | Freq: Two times a day (BID) | ORAL | Status: DC
  Administered 2017-07-23 – 2017-07-25 (×4): 150 mg via ORAL
  Filled 2017-07-23 (×5): qty 1

## 2017-07-23 MED ORDER — FAMOTIDINE 20 MG PO TABS
20.0000 mg | ORAL_TABLET | Freq: Once | ORAL | Status: AC
  Administered 2017-07-23: 20 mg via ORAL

## 2017-07-23 MED ORDER — TRAMADOL HCL 50 MG PO TABS
50.0000 mg | ORAL_TABLET | Freq: Four times a day (QID) | ORAL | Status: DC
  Administered 2017-07-23 – 2017-07-24 (×5): 50 mg via ORAL
  Filled 2017-07-23 (×6): qty 1

## 2017-07-23 MED ORDER — CHLORHEXIDINE GLUCONATE CLOTH 2 % EX PADS: 6.0000 | MEDICATED_PAD | Freq: Every day | CUTANEOUS | Status: DC

## 2017-07-23 MED ORDER — TRIAMCINOLONE ACETONIDE 55 MCG/ACT NA AERO
2.0000 | INHALATION_SPRAY | Freq: Every day | NASAL | Status: DC
  Filled 2017-07-23: qty 21.6

## 2017-07-23 MED ORDER — LORATADINE 10 MG PO TABS
10.0000 mg | ORAL_TABLET | Freq: Every day | ORAL | Status: DC
  Administered 2017-07-24 – 2017-07-25 (×2): 10 mg via ORAL
  Filled 2017-07-23 (×2): qty 1

## 2017-07-23 MED ORDER — ONDANSETRON HCL 4 MG/2ML IJ SOLN
INTRAMUSCULAR | Status: AC
  Filled 2017-07-23: qty 2

## 2017-07-23 MED ORDER — KETOROLAC TROMETHAMINE 15 MG/ML IJ SOLN
15.0000 mg | Freq: Four times a day (QID) | INTRAMUSCULAR | Status: AC
  Administered 2017-07-23 – 2017-07-24 (×4): 15 mg via INTRAVENOUS
  Filled 2017-07-23 (×4): qty 1

## 2017-07-23 MED ORDER — TRANEXAMIC ACID 1000 MG/10ML IV SOLN
INTRAVENOUS | Status: DC | PRN
  Administered 2017-07-23: 1000 mg via INTRAVENOUS

## 2017-07-23 MED ORDER — ACETAMINOPHEN 500 MG PO TABS
1000.0000 mg | ORAL_TABLET | Freq: Four times a day (QID) | ORAL | Status: AC
  Administered 2017-07-23 – 2017-07-24 (×3): 1000 mg via ORAL
  Filled 2017-07-23 (×4): qty 2

## 2017-07-23 MED ORDER — CEFAZOLIN SODIUM-DEXTROSE 2-4 GM/100ML-% IV SOLN
2.0000 g | Freq: Once | INTRAVENOUS | Status: DC
  Filled 2017-07-23: qty 100

## 2017-07-23 MED ORDER — LORAZEPAM 0.5 MG PO TABS
0.2500 mg | ORAL_TABLET | Freq: Three times a day (TID) | ORAL | Status: DC
  Administered 2017-07-23: 0.25 mg via ORAL
  Filled 2017-07-23: qty 1

## 2017-07-23 MED ORDER — GLYCOPYRROLATE 0.2 MG/ML IJ SOLN
INTRAMUSCULAR | Status: DC | PRN
  Administered 2017-07-23: 0.1 mg via INTRAVENOUS

## 2017-07-23 MED ORDER — ONDANSETRON HCL 4 MG/2ML IJ SOLN
INTRAMUSCULAR | Status: DC | PRN
  Administered 2017-07-23: 4 mg via INTRAVENOUS

## 2017-07-23 MED ORDER — LACTATED RINGERS IV SOLN
INTRAVENOUS | Status: DC
  Administered 2017-07-23 (×2): via INTRAVENOUS

## 2017-07-23 MED ORDER — FENTANYL CITRATE (PF) 100 MCG/2ML IJ SOLN
INTRAMUSCULAR | Status: DC | PRN
  Administered 2017-07-23 (×2): 50 ug via INTRAVENOUS

## 2017-07-23 MED ORDER — ACETAMINOPHEN 10 MG/ML IV SOLN
INTRAVENOUS | Status: DC | PRN
Start: 2017-07-23 — End: 2017-07-23
  Administered 2017-07-23: 1000 mg via INTRAVENOUS

## 2017-07-23 MED ORDER — PANTOPRAZOLE SODIUM 40 MG PO TBEC
40.0000 mg | DELAYED_RELEASE_TABLET | Freq: Every day | ORAL | Status: DC
  Administered 2017-07-24 – 2017-07-25 (×2): 40 mg via ORAL
  Filled 2017-07-23 (×2): qty 1

## 2017-07-23 MED ORDER — SODIUM CHLORIDE 0.9 % IV SOLN
INTRAVENOUS | Status: DC
  Administered 2017-07-23 – 2017-07-24 (×3): via INTRAVENOUS

## 2017-07-23 MED ORDER — DOCUSATE SODIUM 100 MG PO CAPS
100.0000 mg | ORAL_CAPSULE | Freq: Two times a day (BID) | ORAL | Status: DC
  Administered 2017-07-23 – 2017-07-25 (×4): 100 mg via ORAL
  Filled 2017-07-23 (×4): qty 1

## 2017-07-23 MED ORDER — KETOROLAC TROMETHAMINE 30 MG/ML IJ SOLN
30.0000 mg | Freq: Once | INTRAMUSCULAR | Status: AC
  Administered 2017-07-23: 30 mg via INTRAVENOUS

## 2017-07-23 MED ORDER — PROPOFOL 500 MG/50ML IV EMUL
INTRAVENOUS | Status: DC | PRN
  Administered 2017-07-23: 100 ug/kg/min via INTRAVENOUS

## 2017-07-23 MED ORDER — ACETAMINOPHEN 325 MG PO TABS
325.0000 mg | ORAL_TABLET | Freq: Four times a day (QID) | ORAL | Status: DC | PRN
  Administered 2017-07-24 – 2017-07-25 (×3): 650 mg via ORAL
  Filled 2017-07-23 (×3): qty 2

## 2017-07-23 MED ORDER — LIDOCAINE HCL (PF) 2 % IJ SOLN
INTRAMUSCULAR | Status: AC
  Filled 2017-07-23: qty 10

## 2017-07-23 MED ORDER — BUPIVACAINE LIPOSOME 1.3 % IJ SUSP
INTRAMUSCULAR | Status: DC | PRN
  Administered 2017-07-23: 60 mL

## 2017-07-23 MED ORDER — MIDAZOLAM HCL 5 MG/5ML IJ SOLN
INTRAMUSCULAR | Status: DC | PRN
  Administered 2017-07-23: 1 mg via INTRAVENOUS
  Administered 2017-07-23: 3 mg via INTRAVENOUS

## 2017-07-23 MED ORDER — FLEET ENEMA 7-19 GM/118ML RE ENEM: 1.0000 | ENEMA | Freq: Once | RECTAL | Status: DC | PRN

## 2017-07-23 MED ORDER — PHENYLEPHRINE HCL 10 MG/ML IJ SOLN
INTRAMUSCULAR | Status: AC
  Filled 2017-07-23: qty 1

## 2017-07-23 MED ORDER — VANCOMYCIN HCL 1000 MG IV SOLR
1000.0000 mg | Freq: Once | INTRAVENOUS | Status: DC
  Filled 2017-07-23: qty 1000

## 2017-07-23 MED ORDER — ONDANSETRON HCL 4 MG PO TABS: 4.0000 mg | ORAL_TABLET | Freq: Four times a day (QID) | ORAL | Status: DC | PRN

## 2017-07-23 MED ORDER — PROPOFOL 10 MG/ML IV BOLUS
INTRAVENOUS | Status: AC
  Filled 2017-07-23: qty 20

## 2017-07-23 MED ORDER — BISACODYL 10 MG RE SUPP: 10.0000 mg | Freq: Every day | RECTAL | Status: DC | PRN

## 2017-07-23 MED ORDER — ONDANSETRON HCL 4 MG/2ML IJ SOLN: 4.0000 mg | Freq: Once | INTRAMUSCULAR | Status: DC | PRN

## 2017-07-23 MED ORDER — METOCLOPRAMIDE HCL 10 MG PO TABS: 5.0000 mg | ORAL_TABLET | Freq: Three times a day (TID) | ORAL | Status: DC | PRN

## 2017-07-23 MED ORDER — OXYCODONE HCL 5 MG PO TABS
5.0000 mg | ORAL_TABLET | ORAL | Status: DC | PRN
  Administered 2017-07-23 – 2017-07-24 (×5): 5 mg via ORAL
  Administered 2017-07-25: 10 mg via ORAL
  Administered 2017-07-25: 5 mg via ORAL
  Filled 2017-07-23 (×2): qty 1
  Filled 2017-07-23: qty 2
  Filled 2017-07-23 (×4): qty 1

## 2017-07-23 MED ORDER — BUPIVACAINE-EPINEPHRINE (PF) 0.5% -1:200000 IJ SOLN
INTRAMUSCULAR | Status: DC | PRN
  Administered 2017-07-23: 30 mL via PERINEURAL

## 2017-07-23 MED ORDER — MIDAZOLAM HCL 2 MG/2ML IJ SOLN
INTRAMUSCULAR | Status: AC
  Filled 2017-07-23: qty 4

## 2017-07-23 MED ORDER — FENTANYL CITRATE (PF) 100 MCG/2ML IJ SOLN: 25.0000 ug | INTRAMUSCULAR | Status: DC | PRN

## 2017-07-23 MED ORDER — PROPOFOL 500 MG/50ML IV EMUL
INTRAVENOUS | Status: AC
  Filled 2017-07-23: qty 50

## 2017-07-23 MED ORDER — ACETAMINOPHEN 10 MG/ML IV SOLN
INTRAVENOUS | Status: AC
  Filled 2017-07-23: qty 100

## 2017-07-23 MED ORDER — FAMOTIDINE 20 MG PO TABS
ORAL_TABLET | ORAL | Status: AC
  Filled 2017-07-23: qty 1

## 2017-07-23 MED ORDER — DIPHENHYDRAMINE HCL 25 MG PO CAPS
25.0000 mg | ORAL_CAPSULE | Freq: Four times a day (QID) | ORAL | Status: DC
  Filled 2017-07-23 (×8): qty 1

## 2017-07-23 SURGICAL SUPPLY — 55 items
BANDAGE ELASTIC 6 LF NS (GAUZE/BANDAGES/DRESSINGS) ×3 IMPLANT
BEARING TIB MENISCAL RT SM 4MM (Joint) ×1 IMPLANT
BLADE SAW SAG 25X90X1.19 (BLADE) ×3 IMPLANT
BLADE SURG SZ20 CARB STEEL (BLADE) ×3 IMPLANT
CANISTER SUCT 1200ML W/VALVE (MISCELLANEOUS) ×3 IMPLANT
CANISTER SUCT 3000ML PPV (MISCELLANEOUS) ×3 IMPLANT
CEMENT VACUUM MIXING SYSTEM (MISCELLANEOUS) ×3 IMPLANT
CHLORAPREP W/TINT 26ML (MISCELLANEOUS) ×3 IMPLANT
COOLER POLAR GLACIER W/PUMP (MISCELLANEOUS) IMPLANT
COVER MAYO STAND STRL (DRAPES) ×3 IMPLANT
CUFF TOURN 24 STER (MISCELLANEOUS) ×3 IMPLANT
CUFF TOURN 30 STER DUAL PORT (MISCELLANEOUS) IMPLANT
DRAPE IMP U-DRAPE 54X76 (DRAPES) ×3 IMPLANT
DRAPE INCISE IOBAN 66X45 STRL (DRAPES) IMPLANT
DRAPE SHEET LG 3/4 BI-LAMINATE (DRAPES) ×3 IMPLANT
DRSG OPSITE POSTOP 4X10 (GAUZE/BANDAGES/DRESSINGS) ×3 IMPLANT
DRSG OPSITE POSTOP 4X8 (GAUZE/BANDAGES/DRESSINGS) ×3 IMPLANT
ELECT CAUTERY BLADE 6.4 (BLADE) ×3 IMPLANT
ELECT REM PT RETURN 9FT ADLT (ELECTROSURGICAL) ×3
ELECTRODE REM PT RTRN 9FT ADLT (ELECTROSURGICAL) ×1 IMPLANT
GLOVE BIO SURGEON STRL SZ7.5 (GLOVE) ×12 IMPLANT
GLOVE BIO SURGEON STRL SZ8 (GLOVE) ×18 IMPLANT
GLOVE BIOGEL PI IND STRL 8 (GLOVE) ×1 IMPLANT
GLOVE BIOGEL PI INDICATOR 8 (GLOVE) ×2
GLOVE INDICATOR 8.0 STRL GRN (GLOVE) ×15 IMPLANT
GOWN STRL REUS W/ TWL LRG LVL3 (GOWN DISPOSABLE) ×1 IMPLANT
GOWN STRL REUS W/ TWL XL LVL3 (GOWN DISPOSABLE) ×1 IMPLANT
GOWN STRL REUS W/TWL LRG LVL3 (GOWN DISPOSABLE) ×2
GOWN STRL REUS W/TWL XL LVL3 (GOWN DISPOSABLE) ×2
HOLDER FOLEY CATH W/STRAP (MISCELLANEOUS) ×3 IMPLANT
HOOD PEEL AWAY FLYTE STAYCOOL (MISCELLANEOUS) ×12 IMPLANT
IMMBOLIZER KNEE 19 BLUE UNIV (SOFTGOODS) IMPLANT
KIT TURNOVER KIT A (KITS) ×3 IMPLANT
NDL SAFETY ECLIPSE 18X1.5 (NEEDLE) ×2 IMPLANT
NEEDLE HYPO 18GX1.5 SHARP (NEEDLE) ×4
NEEDLE SPNL 20GX3.5 QUINCKE YW (NEEDLE) ×3 IMPLANT
NS IRRIG 1000ML POUR BTL (IV SOLUTION) ×3 IMPLANT
PACK TOTAL KNEE (MISCELLANEOUS) ×3 IMPLANT
PAD WRAPON POLAR KNEE (MISCELLANEOUS) IMPLANT
PULSAVAC PLUS IRRIG FAN TIP (DISPOSABLE) ×3
SOL .9 NS 3000ML IRR  AL (IV SOLUTION) ×2
SOL .9 NS 3000ML IRR UROMATIC (IV SOLUTION) ×1 IMPLANT
STAPLER SKIN PROX 35W (STAPLE) ×3 IMPLANT
SUCTION FRAZIER HANDLE 10FR (MISCELLANEOUS) ×2
SUCTION TUBE FRAZIER 10FR DISP (MISCELLANEOUS) ×1 IMPLANT
SUT VIC AB 0 CT1 36 (SUTURE) ×9 IMPLANT
SUT VIC AB 2-0 CT1 27 (SUTURE) ×6
SUT VIC AB 2-0 CT1 TAPERPNT 27 (SUTURE) ×3 IMPLANT
SYR 10ML LL (SYRINGE) ×3 IMPLANT
SYR 20CC LL (SYRINGE) ×3 IMPLANT
SYR 30ML LL (SYRINGE) ×9 IMPLANT
TIP FAN IRRIG PULSAVAC PLUS (DISPOSABLE) ×1 IMPLANT
TRAY FOLEY MTR SLVR 16FR STAT (SET/KITS/TRAYS/PACK) ×3 IMPLANT
UNICOMPARTMENTAL KNEE MENISCAL (Joint) ×3 IMPLANT
WRAPON POLAR PAD KNEE (MISCELLANEOUS)

## 2017-07-23 NOTE — Anesthesia Procedure Notes (Signed)
Spinal  Patient location during procedure: OR Start time: 07/23/2017 1:50 PM End time: 07/23/2017 1:56 PM Staffing Anesthesiologist: Naomie DeanKephart, William K, MD Resident/CRNA: Sherol DadeMacMang, Eily Louvier H, CRNA Performed: resident/CRNA  Preanesthetic Checklist Completed: patient identified, site marked, surgical consent, pre-op evaluation, timeout performed, IV checked, risks and benefits discussed and monitors and equipment checked Spinal Block Patient position: sitting Prep: ChloraPrep Patient monitoring: heart rate, continuous pulse ox and blood pressure Approach: midline Location: L3-4 Injection technique: single-shot Needle Needle type: Pencan  Needle gauge: 24 G Assessment Sensory level: T4 Additional Notes Pt tolerated well.

## 2017-07-23 NOTE — Consult Note (Signed)
Pharmacy Antibiotic Note  Jared Kapurarl H Martel Jr. is a 62 y.o. male admitted on 07/23/2017 with possible Septic Right Knee.  Pharmacy has been consulted for Vancomycin dosing. Patient received Vancomycin 1g IV x 1 dose pre-op around 1300 today.   Plan: Ke: 0.092   T1/2: 7.53   VD: 59.99  Will start Vancomycin 1000mg  IV every 8 hours.  Goal trough 15-20 mcg/mL. Calculated trough at Css is 15.5. Trough level ordered prior to 4th dose. Will monitor renal function and adjust dose as needed.   Will F/U on aerobic/anaerobic Cx. Per Dr. Joice LoftsPoggi - May only need a few days of IV abx coverage to ensure Cx are negative.   Height: 5\' 10"  (177.8 cm) Weight: 189 lb (85.7 kg) IBW/kg (Calculated) : 73  Temp (24hrs), Avg:97.7 F (36.5 C), Min:97.4 F (36.3 C), Max:98 F (36.7 C)  Recent Labs  Lab 07/17/17 0335 07/23/17 1211  WBC 12.0* 11.7*  CREATININE 0.90 0.71    Estimated Creatinine Clearance: 98.9 mL/min (by C-G formula based on SCr of 0.71 mg/dL).    No Known Allergies  Antimicrobials this admission: 6/27 Vancomycin >   Microbiology results: 6/27 Wound Cx: pending  6/27 UCx: pending 6/27 MRSA PCR: negative   Thank you for allowing pharmacy to be a part of this patient's care.  Gardner CandleSheema M Roshawn Lacina, PharmD, BCPS Clinical Pharmacist 07/23/2017 6:20 PM

## 2017-07-23 NOTE — H&P (Signed)
Subjective:  Chief complaint:  Painful and swollen right partial knee replacement.  The patient is a 62 y.o. male who presents today to undergo a formal irrigation and debridement with polyethylene exchange of the right knee.  The patient is 1 week s/p right partial knee replacement.  His hospital stay was uneventful however upon going home on Saturday he evaluated by physical therapy and complained of severe pain to the point where they were unable to do much activity.  He was evaluated in the office on Monday where a right knee aspiration was performed and so far the cultures have returned negative.  The patient continued to have severe pain in his knee as well as moderate swelling and some low-grade temperatures.  Because of this ongoing pain it was decided to bring the patient back to the operating room today for definitive management.  Patient Active Problem List   Diagnosis Date Noted  . Status post right partial knee replacement 07/16/2017   Past Medical History:  Diagnosis Date  . Anxiety   . Arthritis 06/2017   osteoarthritis of lower extremities  . Depression   . Hypercholesteremia   . Neuromuscular disorder (HCC)    neuralgia, neuritis and radiculitis    Past Surgical History:  Procedure Laterality Date  . BACK SURGERY  1998   cervical fx. plates & screws removed, 2005 cages in.  . COLONOSCOPY, ESOPHAGOGASTRODUODENOSCOPY (EGD) AND ESOPHAGEAL DILATION    . JOINT REPLACEMENT Left 2017   partial knee replacement  . PARTIAL KNEE ARTHROPLASTY Right 07/16/2017   Procedure: UNICOMPARTMENTAL KNEE;  Surgeon: Christena Flake, MD;  Location: [facility] ORS;  Service: Orthopedics;  Laterality: Right;  . POSTERIOR LAMINECTOMY / DECOMPRESSION LUMBAR SPINE  1993  . TONSILLECTOMY     as a child    Medications Prior to Admission  Medication Sig Dispense Refill Last Dose  . acetaminophen (TYLENOL) 500 MG tablet Take 1,000 mg by mouth 3 (three) times daily.   07/22/2017 at Unknown time  . buPROPion  (WELLBUTRIN SR) 150 MG 12 hr tablet Take 150 mg by mouth 2 (two) times daily. 0500 & 1200   07/23/2017 at Unknown time  . cetirizine (ZYRTEC) 10 MG tablet Take 10 mg by mouth daily.   07/23/2017 at Unknown time  . chlorpheniramine (CHLOR-TRIMETON) 4 MG tablet Take 4 mg by mouth 2 (two) times daily. NOON & NIGHT.   07/22/2017 at Unknown time  . Cholecalciferol (VITAMIN D3) 2000 units TABS Take 2,000 Units by mouth daily.   07/22/2017 at Unknown time  . docusate sodium (COLACE) 100 MG capsule Take 100 mg by mouth 3 (three) times daily.   07/22/2017 at Unknown time  . enoxaparin (LOVENOX) 40 MG/0.4ML injection Inject 0.4 mLs (40 mg total) into the skin daily. 14 Syringe 0 07/22/2017 at Unknown time  . folic acid (FOLVITE) 1 MG tablet Take 1 mg by mouth daily.  11 07/22/2017 at Unknown time  . Garlic 1000 MG CAPS Take 1,000 mg by mouth daily.   07/22/2017 at Unknown time  . Glucosamine-Chondroitin (COSAMIN DS PO) Take 2 tablets by mouth daily. (1500mg -1200mg )   07/22/2017 at Unknown time  . HYDROcodone-acetaminophen (NORCO/VICODIN) 5-325 MG tablet Take 1 tablet by mouth every 4 (four) hours as needed for moderate pain. (Patient taking differently: Take 1 tablet by mouth every 4 (four) hours as needed (FOR PAIN WITH PHYSICAL THERAPY (TUESDAYS & THURSDAY)). ) 20 tablet 0 07/23/2017 at Unknown time  . LORazepam (ATIVAN) 0.5 MG tablet Take 0.5 tablets by mouth  3 (three) times daily.  2 07/23/2017 at Unknown time  . meloxicam (MOBIC) 15 MG tablet Take 15 mg by mouth at bedtime.   07/22/2017 at Unknown time  . Omega-3 Fatty Acids (FISH OIL) 1000 MG CAPS Take 1,000 mg by mouth daily.   07/22/2017 at Unknown time  . oxyCODONE (OXY IR/ROXICODONE) 5 MG immediate release tablet Take 1-2 tablets (5-10 mg total) by mouth every 4 (four) hours as needed for moderate pain (pain score 4-6). 60 tablet 0 07/22/2017 at Unknown time  . simvastatin (ZOCOR) 40 MG tablet Take 40 mg by mouth at bedtime.   07/22/2017 at Unknown time  .  triamcinolone (NASACORT) 55 MCG/ACT AERO nasal inhaler Place 2 sprays into the nose daily.   07/23/2017 at Unknown time  . vitamin E 400 UNIT capsule Take 400 Units by mouth daily.   07/22/2017 at Unknown time   No Known Allergies  Social History   Tobacco Use  . Smoking status: Former Smoker    Packs/day: 2.50    Years: 38.00    Pack years: 95.00    Types: Cigarettes    Last attempt to quit: 2008    Years since quitting: 11.4  . Smokeless tobacco: Never Used  Substance Use Topics  . Alcohol use: Not Currently    Frequency: Never    Comment: 8 years clean    History reviewed. No pertinent family history.   Review of Systems: As noted above. The patient denies any chest pain, shortness of breath, nausea, vomiting, diarrhea, constipation, belly pain, blood in his/her stool, or burning with urination.  Objective: Temp:  [97.7 F (36.5 C)] 97.7 F (36.5 C) (06/27 1254) Pulse Rate:  [90] 90 (06/27 1254) Resp:  [18] 18 (06/27 1254) BP: (120)/(96) 120/96 (06/27 1254) SpO2:  [98 %] 98 % (06/27 1254) Weight:  [85.7 kg (189 lb)] 85.7 kg (189 lb) (06/27 1254)  Physical Exam: General:  Alert, no acute distress Psychiatric:  Patient is competent for consent with normal mood and affect Cardiovascular:  RRR  Respiratory:  Clear to auscultation. No wheezing. Non-labored breathing GI:  Abdomen is soft and non-tender Skin:  No lesions in the area of chief complaint Neurologic:  Sensation intact distally Lymphatic:  No axillary or cervical lymphadenopathy  Orthopedic Exam:  Skin inspection of the right knee demonstrates his surgical incision to be healing well. There is no drainage or discharge from the wound. There is mild erythema around the knee, as well as mild warmth, which is as expected at this time post operatively. He has a 1-2+ effusion, as well as moderate pain to palpation over the medial and lateral aspects of the knee. He has limited range of motion of his knee secondary to pain  and apprehension. He is neurovascularly intact to the right lower extremity and foot. He has no calf tenderness and a negative Homan's sign.  Imaging Review: Post-op x-rays from 07/16/17 are available in the [facility] Epic system for review.  Assessment: Painful right partial knee replacement. Possible septic partial knee replacement.  Plan: 1.  Treatment options were discussed with the patient prior to today. 2.  Presents today for a formal irrigation and debridement of the right partial knee with poly-exchange. 3.  Risks and benefits of the surgery were discussed today with the patient.  This document will serve as the surgical H&P.   The risks (including bleeding, infection, nerve and/or blood vessel injury, persistent or recurrent pain, loosening or failure of the components, leg length inequality,  dislocation, need for further surgery, blood clots, strokes, heart attacks or arrhythmias, pneumonia, etc.)  The patient states his understanding and agrees to proceed. He agrees to a blood transfusion if necessary. A consent will be signed at the time of surgery.  Valeria BatmanJ. Lance McGhee, PA-C [facility]

## 2017-07-23 NOTE — Transfer of Care (Signed)
Immediate Anesthesia Transfer of Care Note  Patient: Jared Kapurarl H Bottomley Jr.  Procedure(s) Performed: KNEE ARTHROTOMY WITH POLY EXCHANGE (Right )  Patient Location: PACU  Anesthesia Type:Spinal  Level of Consciousness: sedated  Airway & Oxygen Therapy: Patient Spontanous Breathing and Patient connected to nasal cannula oxygen  Post-op Assessment: Report given to RN and Post -op Vital signs reviewed and stable  Post vital signs: Reviewed and stable  Last Vitals:  Vitals Value Taken Time  BP 110/67 07/23/2017  3:35 PM  Temp    Pulse 83 07/23/2017  3:39 PM  Resp 14 07/23/2017  3:39 PM  SpO2 94 % 07/23/2017  3:39 PM  Vitals shown include unvalidated device data.  Last Pain:  Vitals:   07/23/17 1254  TempSrc: Oral  PainSc: 5          Complications: No apparent anesthesia complications

## 2017-07-23 NOTE — Anesthesia Preprocedure Evaluation (Signed)
Anesthesia Evaluation  Patient identified by MRN, date of birth, ID band Patient awake    Reviewed: Allergy & Precautions, NPO status , Patient's Chart, lab work & pertinent test results  History of Anesthesia Complications Negative for: history of anesthetic complications  Airway Mallampati: III       Dental  (+) Edentulous Upper, Edentulous Lower   Pulmonary neg sleep apnea, neg COPD, former smoker,           Cardiovascular (-) hypertension(-) Past MI and (-) CHF (-) dysrhythmias (-) Valvular Problems/Murmurs     Neuro/Psych neg Seizures Anxiety Depression    GI/Hepatic Neg liver ROS, neg GERD  ,  Endo/Other  neg diabetes  Renal/GU negative Renal ROS     Musculoskeletal   Abdominal   Peds  Hematology   Anesthesia Other Findings   Reproductive/Obstetrics                             Anesthesia Physical Anesthesia Plan  ASA: II  Anesthesia Plan: Spinal   Post-op Pain Management:    Induction:   PONV Risk Score and Plan:   Airway Management Planned:   Additional Equipment:   Intra-op Plan:   Post-operative Plan:   Informed Consent: I have reviewed the patients History and Physical, chart, labs and discussed the procedure including the risks, benefits and alternatives for the proposed anesthesia with the patient or authorized representative who has indicated his/her understanding and acceptance.     Plan Discussed with:   Anesthesia Plan Comments:         Anesthesia Quick Evaluation

## 2017-07-23 NOTE — Op Note (Addendum)
07/23/2017  3:07 PM  Patient:   Jared KapurEarl H Davidoff Jr.  Pre-Op Diagnosis:   Possible septic joint status post right partial knee replacement.  Post-Op Diagnosis:   Painful hemarthrosis status post right partial knee replacement.  Procedure:   Irrigation and debridement with polyethylene exchange, right knee.  Surgeon:   Maryagnes AmosJ. Jeffrey Poggi, MD  Assistant:   Horris LatinoLance McGhee, PA-C  Anesthesia:   Spinal  Findings:   As above. No obvious infection was noted, although there was a large hemarthrosis. The components are well-positioned and without evidence of loosening.  Complications:   None  Fluids:   1600 cc crystalloid  EBL:   20 cc  UOP:   100 cc  TT:   45 minutes at 300 mmHg  Drains:   None  Closure:   Staples  Implants:   Small 4 mm meniscal bearing for Biomet Oxford UKA system.  Brief Clinical Note:   The patient is a 62 year old male who is now 1 week status post a right partial knee replacement. The patient did well for the first 48 hours then began to notice increased pain in his right knee. He also complained of some low-grade fevers and significant pain with any attempted range of motion. He was evaluated in the office 3 days ago and the knee aspirated. The knee aspirate was negative for any bacterial growth, and no organisms were seen on the Gram stain. However, he did not show clinical improvement, so it was elected to proceed with a formal irrigation and debridement with polyethylene exchange of his right partial knee replacement.  Procedure:  The patient was brought into the operating room and a spinal placed by the anesthesiologist. The patient was lain in the supine position and a Foley catheter inserted. The patient was repositioned so that the non-surgical leg was placed in a flexed and abducted position in the yellow fin leg holder while the surgical extremity was placed over the Biomet leg holder. The retained surgical staples were removed before the right lower extremity  was prepped with ChloraPrep solution and draped sterilely. Preoperative antibiotics were administered. After performing a timeout to verify the appropriate surgical site, the limb was exsanguinated with an Esmarch and the tourniquet inflated to 300 mmHg. The previous incision was reopened and the retained Vicryl sutures removed. The opening between the medial border of the patella tendon and the medial retinaculum was reopened to expose the partial knee components. A culture was obtained at this time before the hemarthrosis was removed. The polyethylene insert was removed as well. While removing the polyethylene insert, the insert was noted to be extremely tight.  The knee was irrigated thoroughly with 3 L of bacitracin saline solution using the jet lavage system before a 4 mm polyethylene insert was trialed. This appeared to fit quite well and was not unduly tight, so it was elected to proceed with replacing the old polyethylene with a new 4 mm polyethylene meniscal bearing insert. This demonstrated excellent tracking while the knee was placed through a range of motion, and showed no evidence towards subluxation or dislocation. In addition, it did not appear to be too tight.  The wound was irrigated with an additional liter of sterile saline solution using the jet lavage system before the retinacular layer was reapproximated using #0 Vicryl interrupted sutures. At this point, 1 g of transexemic acid in 10 cc of normal saline was injected intra-articularly. The subcutaneous tissues were closed in two layers using 2-0 Vicryl interrupted sutures before the  skin was closed using staples. A sterile occlusive dressing was applied to the knee before the patient was awakened. The patient was transferred back to his hospital bed and returned to the recovery room in satisfactory condition after tolerating the procedure well.

## 2017-07-23 NOTE — Progress Notes (Signed)
PHARMACIST - PHYSICIAN ORDER COMMUNICATION  CONCERNING: P&T Medication Policy on Herbal Medications  DESCRIPTION:  This patient's orders for: Garlic Capsules 1000mg  and Glucosamine-Chondroitin 500-400mg     has been noted.  This product(s) is classified as an "herbal" or natural product. Due to a lack of definitive safety studies or FDA approval, nonstandard manufacturing practices, plus the potential risk of unknown drug-drug interactions while on inpatient medications, the Pharmacy and Therapeutics Committee does not permit the use of "herbal" or natural products of this type within [facility].   ACTION TAKEN: The pharmacy department is unable to verify this order at this time.  Please reevaluate patient's clinical condition at discharge and address if the herbal or natural product(s) should be resumed at that time.  Gardner CandleSheema M Brewer Hitchman, PharmD, BCPS Clinical Pharmacist 07/23/2017 6:00 PM

## 2017-07-23 NOTE — H&P (Signed)
Paper H&P to be scanned into permanent record. H&P reviewed and patient re-examined. No changes. 

## 2017-07-23 NOTE — Anesthesia Post-op Follow-up Note (Signed)
Anesthesia QCDR form completed.        

## 2017-07-24 ENCOUNTER — Encounter: Payer: Self-pay | Admitting: Surgery

## 2017-07-24 LAB — CBC WITH DIFFERENTIAL/PLATELET
Basophils Absolute: 0 10*3/uL (ref 0–0.1)
Basophils Relative: 0 %
EOS ABS: 0 10*3/uL (ref 0–0.7)
EOS PCT: 0 %
HCT: 39.2 % — ABNORMAL LOW (ref 40.0–52.0)
Hemoglobin: 13.5 g/dL (ref 13.0–18.0)
LYMPHS ABS: 1.4 10*3/uL (ref 1.0–3.6)
LYMPHS PCT: 13 %
MCH: 33 pg (ref 26.0–34.0)
MCHC: 34.6 g/dL (ref 32.0–36.0)
MCV: 95.5 fL (ref 80.0–100.0)
MONOS PCT: 9 %
Monocytes Absolute: 1 10*3/uL (ref 0.2–1.0)
Neutro Abs: 8.9 10*3/uL — ABNORMAL HIGH (ref 1.4–6.5)
Neutrophils Relative %: 78 %
PLATELETS: 251 10*3/uL (ref 150–440)
RBC: 4.1 MIL/uL — ABNORMAL LOW (ref 4.40–5.90)
RDW: 12.8 % (ref 11.5–14.5)
WBC: 11.4 10*3/uL — ABNORMAL HIGH (ref 3.8–10.6)

## 2017-07-24 LAB — BASIC METABOLIC PANEL
ANION GAP: 9 (ref 5–15)
BUN: 21 mg/dL (ref 8–23)
CALCIUM: 8.7 mg/dL — AB (ref 8.9–10.3)
CO2: 21 mmol/L — ABNORMAL LOW (ref 22–32)
Chloride: 106 mmol/L (ref 98–111)
Creatinine, Ser: 0.79 mg/dL (ref 0.61–1.24)
GLUCOSE: 199 mg/dL — AB (ref 70–99)
Potassium: 3.7 mmol/L (ref 3.5–5.1)
SODIUM: 136 mmol/L (ref 135–145)

## 2017-07-24 LAB — URINE CULTURE: Culture: NO GROWTH

## 2017-07-24 LAB — VANCOMYCIN, TROUGH: Vancomycin Tr: 15 ug/mL (ref 15–20)

## 2017-07-24 LAB — BODY FLUID CULTURE: CULTURE: NO GROWTH

## 2017-07-24 LAB — SEDIMENTATION RATE: SED RATE: 56 mm/h — AB (ref 0–20)

## 2017-07-24 LAB — C-REACTIVE PROTEIN: CRP: 4.1 mg/dL — AB (ref <1.0)

## 2017-07-24 MED ORDER — LORAZEPAM 0.5 MG PO TABS
0.5000 mg | ORAL_TABLET | Freq: Three times a day (TID) | ORAL | Status: DC
  Administered 2017-07-24 – 2017-07-25 (×4): 0.5 mg via ORAL
  Filled 2017-07-24 (×4): qty 1

## 2017-07-24 MED ORDER — SULFAMETHOXAZOLE-TRIMETHOPRIM 800-160 MG PO TABS: 1.0000 | ORAL_TABLET | Freq: Two times a day (BID) | ORAL | 0 refills | Status: DC

## 2017-07-24 NOTE — Progress Notes (Signed)
Clinical Social Worker (CSW) received SNF consult. PT is recommending home health. RN case manager is aware of above. Please reconsult if future social work needs arise. CSW signing off.   Yazmeen Woolf, LCSW [phone] 

## 2017-07-24 NOTE — Consult Note (Signed)
Pharmacy Antibiotic Note  Jared Kapurarl H Vanwyhe Jr. is a 62 y.o. male admitted on 07/23/2017 with possible Septic Right Knee.  Pharmacy has been consulted for Vancomycin dosing. Patient currently receiving Vancomycin 1g IV q8h.   6/28 17:30 Vancomycin trough resulted at 15 mcg/ml  Plan: Will continue with Vancomycin 1g IV q8h. Monitor renal function. Will need to check a repeat trough level if patient continues on Vancomycin for several more days.  Will F/U on aerobic/anaerobic Cx. Per Dr. Joice LoftsPoggi - May only need a few days of IV abx coverage to ensure Cx are negative.   Height: 5\' 10"  (177.8 cm) Weight: 189 lb (85.7 kg) IBW/kg (Calculated) : 73  Temp (24hrs), Avg:97.8 F (36.6 C), Min:97.5 F (36.4 C), Max:98 F (36.7 C)  Recent Labs  Lab 07/23/17 1211 07/24/17 0627 07/24/17 1933  WBC 11.7* 11.4*  --   CREATININE 0.71 0.79  --   VANCOTROUGH  --   --  15    Estimated Creatinine Clearance: 98.9 mL/min (by C-G formula based on SCr of 0.79 mg/dL).    No Known Allergies  Antimicrobials this admission: 6/27 Vancomycin >   Microbiology results: 6/27 Wound Cx: pending  6/27 UCx: pending 6/27 MRSA PCR: negative   Thank you for allowing pharmacy to be a part of this patient's care.  Clovia CuffLisa Krystall Kruckenberg, PharmD, BCPS 07/24/2017 8:14 PM

## 2017-07-24 NOTE — Care Management Note (Signed)
Case Management Note  Patient Details  Name: Lilian Kapurarl H Levier Jr. MRN: [id number] Date of Birth: 1955/12/04  Subjective/Objective:    RNCM completed consult on patient. Patient currently lives at home with his wife Rinaldo Cloudamela [phone]. He has had multiple recent injuries and issues with his knee. On previous admission [facility]. Per patient he would like to resume these services at discharge. Has a bsc and walker at home. Wife provides transport. PCP is Dr Burnett [facility]Hedrick[facility]                 Action/Plan:  Will resume services with [facility] at discharge Expected Discharge Date:  07/26/17               Expected Discharge Plan:     In-House Referral:     Discharge planning Services     Post Acute Care Choice:    Choice offered to:     DME Arranged:    DME Agency:     HH Arranged:    HH Agency:     Status of Service:     If discussed at [employer] Meetings, dates discussed:    Additional Comments:  Virgel ManifoldJosh A Bradyn Vassey, RN 07/24/2017, 12:15 PM

## 2017-07-24 NOTE — Anesthesia Postprocedure Evaluation (Signed)
Anesthesia Post Note  Patient: Jared Kapurarl H Guillot Jr.  Procedure(s) Performed: KNEE ARTHROTOMY WITH POLY EXCHANGE (Right )  Patient location during evaluation: Nursing Unit Anesthesia Type: Spinal Level of consciousness: awake, awake and alert and oriented Pain management: pain level controlled Vital Signs Assessment: post-procedure vital signs reviewed and stable Respiratory status: spontaneous breathing, nonlabored ventilation and respiratory function stable Cardiovascular status: blood pressure returned to baseline and stable Postop Assessment: no headache and no backache Anesthetic complications: no     Last Vitals:  Vitals:   07/24/17 0010 07/24/17 0409  BP: 111/68 110/64  Pulse: 85 82  Resp: 19 19  Temp: (!) 36.4 C 36.5 C  SpO2: 94% 96%    Last Pain:  Vitals:   07/24/17 0730  TempSrc:   PainSc: 1                  Chiropodisttephanie Arda Daggs

## 2017-07-24 NOTE — Progress Notes (Signed)
Physical Therapy Treatment Patient Details Name: Jared Kapurarl H Babic Jr. MRN: [id number] DOB: [birth year] Today's Date: 07/24/2017    History of Present Illness admitted for acute hospitalization status post irrigation and debridement to R knee with poly exchange (6/27), WBAT. Of note, initial TKR 6/20 wiht subsequent discharge home with HHPT services.    PT Comments    Improving mechanics to R LE with transfers and gait.  Able to initiate stair training with bilat UEs on L ascending rail, cga/close sup-good R knee control/stability; min cuing for sequence and technique.   Follow Up Recommendations  Home health PT     Equipment Recommendations  None recommended by PT    Recommendations for Other Services       Precautions / Restrictions Precautions Precautions: Fall;Knee Restrictions Weight Bearing Restrictions: Yes RLE Weight Bearing: Weight bearing as tolerated    Mobility  Bed Mobility Overal bed mobility: Modified Independent Bed Mobility: Supine to Sit     Supine to sit: Modified independent (Device/Increase time)     General bed mobility comments: independently manages R LE without difficulty  Transfers Overall transfer level: Needs assistance Equipment used: Rolling walker (2 wheeled) Transfers: Sit to/from Stand Sit to Stand: Supervision         General transfer comment: cuing for R LE foot placement on ground, active use as tolerated  Ambulation/Gait Ambulation/Gait assistance: Supervision Gait Distance (Feet): 50 Feet(x2) Assistive device: Rolling walker (2 wheeled)       General Gait Details: reciprocal stepping pattern; more relaxed position with improved mechanics to R LE   Stairs Stairs: Yes Stairs assistance: Min guard;Supervision Stair Management: One rail Left Number of Stairs: 4 General stair comments: bilat UEs on L ascending rail; min cuing for sequence.  Good R LE control/stability in periods of modified SLS.   Wheelchair Mobility     Modified Rankin (Stroke Patients Only)       Balance Overall balance assessment: Needs assistance Sitting-balance support: Feet supported;No upper extremity supported Sitting balance-Leahy Scale: Good     Standing balance support: Bilateral upper extremity supported Standing balance-Leahy Scale: Fair                              Cognition Arousal/Alertness: Awake/alert Behavior During Therapy: WFL for tasks assessed/performed;Anxious Overall Cognitive Status: Within Functional Limits for tasks assessed                                        Exercises Total Joint Exercises Goniometric ROM: R knee, act assist: 7-76 degrees, limited by pain (generally fearful, anxious of ROM) Other Exercises Other Exercises: Supine LE therex, 1x10, AROM for muscular strength/endurance with functional activities: ankle pumps, quad sets, SAQs, heel slides, hip abduct/adduct and SLR.  Good quad activation and control; end-range flex/ext limited by pain. Other Exercises: Toilet transfer, ambulatory with RW, close sup; sit/stand from standard toilet height with grab bar, close sup. Maintains R LE anterior to BOS, knee extended most of transition. Other Exercises: Upper and lower body dressing, set up/sup in unsupported sitting edge of bed.  Min cuing for dressing techniques to maximize indep.    General Comments        Pertinent Vitals/Pain Pain Assessment: Faces Pain Score: 4  Faces Pain Scale: Hurts little more Pain Location: R knee Pain Descriptors / Indicators: Operative site guarding;Aching Pain Intervention(s): Limited activity  within patient's tolerance;Monitored during session;Repositioned;Premedicated before session    Home Living Family/patient expects to be discharged to:: Private residence Living Arrangements: Spouse/significant other Available Help at Discharge: Family Type of Home: House Home Access: Stairs to enter Entrance Stairs-Rails: Left Home  Layout: One level Home Equipment: Environmental consultant - 2 wheels;Cane - single point;Bedside commode      Prior Function Level of Independence: Independent with assistive device(s)      Comments: Mod indep with ADLs, household and community mobilization with [facility]   PT Goals (current goals can now be found in the care plan section) Acute Rehab PT Goals Patient Stated Goal: to have a better quality of life PT Goal Formulation: With patient Time For Goal Achievement: 07/31/17 Potential to Achieve Goals: Good Progress towards PT goals: Progressing toward goals    Frequency    BID      PT Plan Current plan remains appropriate    Co-evaluation              AM-PAC PT "6 Clicks" Daily Activity  Outcome Measure  Difficulty turning over in bed (including adjusting bedclothes, sheets and blankets)?: None Difficulty moving from lying on back to sitting on the side of the bed? : None Difficulty sitting down on and standing up from a chair with arms (e.g., wheelchair, bedside commode, etc,.)?: None Help needed moving to and from a bed to chair (including a wheelchair)?: A Little Help needed walking in hospital room?: A Little Help needed climbing 3-5 steps with a railing? : A Little 6 Click Score: 21    End of Session Equipment Utilized During Treatment: Gait belt Activity Tolerance: Patient tolerated treatment well Patient left: (in bathroom, CNA to attend to once complete (patient aware/agreeable not to transfer without assist)) Nurse Communication: Mobility status(CNA aware of patient position, to respond to pull cord when patient complete) PT Visit Diagnosis: Unsteadiness on feet (R26.81);Muscle weakness (generalized) (M62.81);Difficulty in walking, not elsewhere classified (R26.2);Pain Pain - Right/Left: Right Pain - part of body: Knee     Time: 1610-9604 PT Time Calculation (min) (ACUTE ONLY): 34 min  Charges:  $Gait Training: 8-22 mins $Therapeutic Activity: 8-22 mins                     G Codes:       Erica Osuna H. Manson Passey, PT, DPT, NCS 07/24/17, 4:01 PM [phone]

## 2017-07-24 NOTE — Evaluation (Signed)
Physical Therapy Evaluation Patient Details Name: Jared Boyd. MRN: [id number] DOB: 16-Jun-1955 Today's Date: 07/24/2017   History of Present Illness  admitted for acute hospitalization status post irrigation and debridement to R knee with poly exchange (6/27), WBAT. Of note, initial TKR 6/20 wiht subsequent discharge home with HHPT services.  Clinical Impression  Upon evaluation, patient alert and oriented; follow commands and demonstrates good effort with all mobility tasks.  R knee generally guarded (as patient fairly anxious, fearful of movement and pain), but able to demonstrate both strength (at least 3-/5) and ROM (7-76 degrees) grossly [facility] for basic transfers and mobility.  Able to complete sit/stand, basic transfers and gait (200') with RW, cga/min assist; extensive cuing for R LE mechanics and active use with all mobility tasks.  Patient very receptive to cuing; working to integrate as appropriate. Would benefit from skilled PT to address above deficits and promote optimal return to PLOF; Recommend transition to HHPT upon discharge from acute hospitalization.     Follow Up Recommendations Home health PT    Equipment Recommendations  None recommended by PT(has all necessary equipment)    Recommendations for Other Services       Precautions / Restrictions Precautions Precautions: Fall;Knee Restrictions Weight Bearing Restrictions: Yes RLE Weight Bearing: Weight bearing as tolerated      Mobility  Bed Mobility               General bed mobility comments: seated in recliner beginning/end of treatment session  Transfers Overall transfer level: Needs assistance Equipment used: Rolling walker (2 wheeled) Transfers: Sit to/from Stand Sit to Stand: Min guard         General transfer comment: good hand placement; maintains R LE anterior to BOS (at times, off the floor) for minimal WBing and active use  Ambulation/Gait Ambulation/Gait assistance: Min  guard;Supervision Gait Distance (Feet): 200 Feet Assistive device: Rolling walker (2 wheeled)       General Gait Details: reciprocal stepping pattern; R LE in very guarded position with limited heel strike/toe off/knee flexion throughout gait cycle.  Improves with cuing, but difficulty sustaining.  Stairs            Wheelchair Mobility    Modified Rankin (Stroke Patients Only)       Balance Overall balance assessment: Needs assistance Sitting-balance support: No upper extremity supported;Feet supported Sitting balance-Leahy Scale: Good     Standing balance support: Bilateral upper extremity supported Standing balance-Leahy Scale: Fair                               Pertinent Vitals/Pain Pain Assessment: 0-10 Pain Score: 4  Pain Location: R knee Pain Descriptors / Indicators: Operative site guarding;Aching Pain Intervention(s): Limited activity within patient's tolerance;Monitored during session;Repositioned    Home Living Family/patient expects to be discharged to:: Private residence Living Arrangements: Spouse/significant other Available Help at Discharge: Family Type of Home: House Home Access: Stairs to enter Entrance Stairs-Rails: Left Entrance Stairs-Number of Steps: 2 Home Layout: One level Home Equipment: Environmental consultant - 2 wheels;Cane - single point;Bedside commode      Prior Function Level of Independence: Independent with assistive device(s)         Comments: Mod indep with ADLs, household and community mobilization with [facility]     Hand Dominance        Extremity/Trunk Assessment   Upper Extremity Assessment Upper Extremity Assessment: Overall WFL for tasks assessed    Lower  Extremity Assessment Lower Extremity Assessment: Generalized weakness(R knee grossly 3-/5 throughout, otherwise 4+ to 5/5 throughout)       Communication   Communication: No difficulties  Cognition Arousal/Alertness: Awake/alert Behavior During Therapy: WFL  for tasks assessed/performed Overall Cognitive Status: Within Functional Limits for tasks assessed                                        General Comments      Exercises Total Joint Exercises Goniometric ROM: R knee, act assist: 7-76 degrees, limited by pain (generally fearful, anxious of ROM) Other Exercises Other Exercises: R knee flex/ext therex seated edge of chair to promote relaxation, ROM. Good quad control/strength; difficulty relaxing extremity for dissociated movement patterns Other Exercises: Extensive education about R LE mechanics with transfers/gait-encouraged to maintain bilat feet flat on floor with transfers (progressing towards symmetrical placement, WBing as able), allow R LE heel strike/toe off/knee flexion as tolearted with gait. Patient voiced understanding; cuing for recall and integration (generally anxious about R LE)   Assessment/Plan    PT Assessment Patient needs continued PT services  PT Problem List Decreased strength;Decreased range of motion;Decreased activity tolerance;Decreased balance;Decreased mobility;Decreased coordination;Decreased cognition;Decreased knowledge of use of DME;Pain       PT Treatment Interventions DME instruction;Gait training;Stair training;Functional mobility training;Therapeutic activities;Therapeutic exercise;Balance training    PT Goals (Current goals can be found in the Care Plan section)  Acute Rehab PT Goals Patient Stated Goal: to have a better quality of life PT Goal Formulation: With patient Time For Goal Achievement: 07/31/17 Potential to Achieve Goals: Good    Frequency BID   Barriers to discharge        Co-evaluation               AM-PAC PT "6 Clicks" Daily Activity  Outcome Measure Difficulty turning over in bed (including adjusting bedclothes, sheets and blankets)?: None Difficulty moving from lying on back to sitting on the side of the bed? : None Difficulty sitting down on and  standing up from a chair with arms (e.g., wheelchair, bedside commode, etc,.)?: A Little Help needed moving to and from a bed to chair (including a wheelchair)?: A Little Help needed walking in hospital room?: A Little Help needed climbing 3-5 steps with a railing? : A Little 6 Click Score: 20    End of Session Equipment Utilized During Treatment: Gait belt Activity Tolerance: Patient tolerated treatment well Patient left: in chair;with call bell/phone within reach   PT Visit Diagnosis: Unsteadiness on feet (R26.81);Muscle weakness (generalized) (M62.81);Difficulty in walking, not elsewhere classified (R26.2);Pain Pain - Right/Left: Right Pain - part of body: Knee    Time: [id number] PT Time Calculation (min) (ACUTE ONLY): 24 min   Charges:   PT Evaluation $PT Eval Moderate Complexity: 1 Mod PT Treatments $Gait Training: 8-22 mins   PT G Codes:        Ned Kakar H. Manson PasseyBrown, PT, DPT, NCS 07/24/17, 2:26 PM [phone]

## 2017-07-24 NOTE — Progress Notes (Signed)
Subjective: 1 Day Post-Op Procedure(s) (LRB): KNEE ARTHROTOMY WITH POLY EXCHANGE (Right) Patient reports pain as 2 on 0-10 scale.   Patient is well, and has had no acute complaints or problems Plan is to go Home after hospital stay. Negative for chest pain and shortness of breath Fever: no Gastrointestinal:Negative for nausea and vomiting  Objective: Vital signs in last 24 hours: Temp:  [97.4 F (36.3 C)-98 F (36.7 C)] 97.7 F (36.5 C) (06/28 0409) Pulse Rate:  [76-90] 82 (06/28 0409) Resp:  [14-19] 19 (06/28 0409) BP: (104-123)/(62-96) 110/64 (06/28 0409) SpO2:  [91 %-98 %] 96 % (06/28 0409) Weight:  [85.7 kg (189 lb)] 85.7 kg (189 lb) (06/27 1254)  Intake/Output from previous day:  Intake/Output Summary (Last 24 hours) at 07/24/2017 0756 Last data filed at 07/24/2017 0700 Gross per 24 hour  Intake 3326.25 ml  Output 1615 ml  Net 1711.25 ml    Intake/Output this shift: No intake/output data recorded.  Labs: Recent Labs    07/23/17 1211 07/24/17 0627  HGB 16.8 13.5   Recent Labs    07/23/17 1211 07/24/17 0627  WBC 11.7* 11.4*  RBC 5.07 4.10*  HCT 48.5 39.2*  PLT 315 251   Recent Labs    07/23/17 1211 07/24/17 0627  NA 137 136  K 3.7 3.7  CL 101 106  CO2 24 21*  BUN 15 21  CREATININE 0.71 0.79  GLUCOSE 104* 199*  CALCIUM 9.8 8.7*   Recent Labs    07/23/17 1211  INR 1.07     EXAM General - Patient is Alert, Appropriate and Oriented Extremity - ABD soft Sensation intact distally Intact pulses distally Dorsiflexion/Plantar flexion intact Incision: dressing C/D/I No cellulitis present Dressing/Incision - clean, dry, no drainage Motor Function - intact, moving foot and toes well on exam.  Able to perform a straight leg raise with minimal assistance.  Past Medical History:  Diagnosis Date  . Anxiety   . Arthritis 06/2017   osteoarthritis of lower extremities  . Depression   . Hypercholesteremia   . Neuromuscular disorder (HCC)    neuralgia, neuritis and radiculitis    Assessment/Plan: 1 Day Post-Op Procedure(s) (LRB): KNEE ARTHROTOMY WITH POLY EXCHANGE (Right) Active Problems:   Septic joint of right knee joint (HCC)  Estimated body mass index is 27.12 kg/m as calculated from the following:   Height as of this encounter: _0  (1.778 m).   Weight as of this encounter: 85.7 kg (189 lb). Advance diet Up with therapy   Labs reviewed this morning.  WBC 11.4.  ESR 56, CRP pending. Continue Vanc at this time, cultures pending. Up with therapy today. Continue to monitor for cultures to return at this time.   If cultures return negative and patient is doing well, can discharge home with 10 day course of Bactrim.  DVT Prophylaxis - Lovenox, Foot Pumps and TED hose Weight-Bearing as tolerated to right leg  J. Cameron Proud, PA-C [facility] Orthopaedic Surgery 07/24/2017, 7:56 AM

## 2017-07-25 LAB — CBC
HCT: 41.9 % (ref 40.0–52.0)
Hemoglobin: 13.9 g/dL (ref 13.0–18.0)
MCH: 32.1 pg (ref 26.0–34.0)
MCHC: 33.3 g/dL (ref 32.0–36.0)
MCV: 96.3 fL (ref 80.0–100.0)
Platelets: 276 10*3/uL (ref 150–440)
RBC: 4.35 MIL/uL — AB (ref 4.40–5.90)
RDW: 12.9 % (ref 11.5–14.5)
WBC: 11.6 10*3/uL — ABNORMAL HIGH (ref 3.8–10.6)

## 2017-07-25 LAB — BASIC METABOLIC PANEL
ANION GAP: 8 (ref 5–15)
BUN: 18 mg/dL (ref 8–23)
CALCIUM: 8.7 mg/dL — AB (ref 8.9–10.3)
CHLORIDE: 109 mmol/L (ref 98–111)
CO2: 22 mmol/L (ref 22–32)
Creatinine, Ser: 0.84 mg/dL (ref 0.61–1.24)
GFR calc non Af Amer: >60 mL/min (ref >60)
GLUCOSE: 108 mg/dL — AB (ref 70–99)
POTASSIUM: 3.7 mmol/L (ref 3.5–5.1)
Sodium: 139 mmol/L (ref 135–145)

## 2017-07-25 NOTE — Care Management Note (Signed)
Case Management Note  Patient Details  Name: Jared Kapurarl H Boyko Jr. MRN: [id number] Date of Birth: 10/20/1955  Subjective/Objective:       Patient to be discharged home today per MD order. RNCM consulted with patient yesterday who informed me he was currently set up with home health services via [facility]. Confirmed this with [name] from [facility]. Contacted [name] this am for confirmation of discharge and resumption of home health services. Patient already has DME arranged with no other needs. Souse to provide transport home today.            [name] RN BSN RNCM [phone]   Action/Plan:   Expected Discharge Date:  07/25/17               Expected Discharge Plan:     In-House Referral:     Discharge planning Services  CM Consult  Post Acute Care Choice:  Home Health, Resumption of Svcs/PTA Provider Choice offered to:  Patient  DME Arranged:    DME Agency:     HH Arranged:  PT HH Agency:  [facility] (formerly [facility])  Status of Service:  Completed, signed off  If discussed at [employer] Meetings, dates discussed:    Additional Comments:  [name], RN 07/25/2017, 8:41 AM

## 2017-07-25 NOTE — Progress Notes (Signed)
Notified Dr Ernest PineHooten of no prescription for pain. Dr Ernest PineHooten wrote a prescription for oxycodone. Placed in chart with discharge instructions. Waiting on patients wife to come transport patient home.

## 2017-07-25 NOTE — Progress Notes (Signed)
Patient discharged home. Instructions and prescriptions given, patient and wife verbalized understanding. Wife transported patient home.

## 2017-07-25 NOTE — Progress Notes (Signed)
Physical Therapy Treatment Patient Details Name: Jared Boyd. MRN: [id number] DOB: 03/05/1955 Today's Date: 07/25/2017    History of Present Illness admitted for acute hospitalization status post irrigation and debridement to R knee with poly exchange (6/27), WBAT. Of note, initial TKR 6/20 wiht subsequent discharge home with HHPT services.    PT Comments    Patient demonstrates significant improvement in ROM this session as well as ability to maneuver in and out of bed. He opted not to complete stair training as he felt comfortable with this from previous session(s). He is still experiencing pain over the medial aspect of his knee, though intermittently through ambulation. He is progressing well towards more independent ambulation and transfers.    Follow Up Recommendations  Home health PT     Equipment Recommendations  None recommended by PT    Recommendations for Other Services       Precautions / Restrictions Precautions Precautions: Fall;Knee Precaution Booklet Issued: Yes (comment) Restrictions Weight Bearing Restrictions: Yes RLE Weight Bearing: Weight bearing as tolerated Other Position/Activity Restrictions: activity as tolerated    Mobility  Bed Mobility Overal bed mobility: Modified Independent Bed Mobility: Supine to Sit;Sit to Supine     Supine to sit: Modified independent (Device/Increase time)     General bed mobility comments: independently manages R LE without difficulty, cuing to use LLE underneath RLE to scoop in and out of bed  Transfers Overall transfer level: Needs assistance Equipment used: Rolling walker (2 wheeled) Transfers: Sit to/from Stand Sit to Stand: Supervision         General transfer comment: Patient is able to perform transfer without loss of balance, appropriate weight shifting.   Ambulation/Gait Ambulation/Gait assistance: Supervision Gait Distance (Feet): 120 Feet Assistive device: Rolling walker (2 wheeled) Gait  Pattern/deviations: Step-through pattern   Gait velocity interpretation: <1.31 ft/sec, indicative of household ambulator General Gait Details: Patient keeps RLE in relative extension, able to perform heel to toe gait without buckling noted.    Stairs             Wheelchair Mobility    Modified Rankin (Stroke Patients Only)       Balance Overall balance assessment: Needs assistance Sitting-balance support: Feet supported;No upper extremity supported Sitting balance-Leahy Scale: Good Sitting balance - Comments: Patient is able to sit at the EOB without loss of balance.    Standing balance support: Bilateral upper extremity supported Standing balance-Leahy Scale: Fair Standing balance comment: Patient does not have any buckling or loss of balance with ambulation.                             Cognition Arousal/Alertness: Awake/alert Behavior During Therapy: WFL for tasks assessed/performed;Anxious Overall Cognitive Status: Within Functional Limits for tasks assessed                                        Exercises Total Joint Exercises Ankle Circles/Pumps: AROM;Both;10 reps Heel Slides: AAROM;Both;15 reps Hip ABduction/ADduction: AROM;10 reps;Both Straight Leg Raises: AROM;10 reps;Both Long Arc Quad: AROM;15 reps;Both Goniometric ROM: 6-83 in dangling position Other Exercises Other Exercises: Patient required cga x 1 to perform sit to stand transfer to commode.     General Comments        Pertinent Vitals/Pain Pain Assessment: 0-10 Pain Score: 6  Pain Location: R knee Pain Descriptors / Indicators: Operative site  guarding;Aching Pain Intervention(s): Limited activity within patient's tolerance;Repositioned;Ice applied;Monitored during session    Home Living                      Prior Function            PT Goals (current goals can now be found in the care plan section) Acute Rehab PT Goals Patient Stated Goal: to have  a better quality of life PT Goal Formulation: With patient Time For Goal Achievement: 07/31/17 Potential to Achieve Goals: Good Progress towards PT goals: Progressing toward goals    Frequency    BID      PT Plan Current plan remains appropriate    Co-evaluation              AM-PAC PT "6 Clicks" Daily Activity  Outcome Measure  Difficulty turning over in bed (including adjusting bedclothes, sheets and blankets)?: None Difficulty moving from lying on back to sitting on the side of the bed? : None Difficulty sitting down on and standing up from a chair with arms (e.g., wheelchair, bedside commode, etc,.)?: None Help needed moving to and from a bed to chair (including a wheelchair)?: None Help needed walking in hospital room?: None Help needed climbing 3-5 steps with a railing? : A Little 6 Click Score: 23    End of Session Equipment Utilized During Treatment: Gait belt Activity Tolerance: Patient tolerated treatment well Patient left: in bed;with nursing/sitter in room;with bed alarm set Nurse Communication: Mobility status PT Visit Diagnosis: Unsteadiness on feet (R26.81);Muscle weakness (generalized) (M62.81);Difficulty in walking, not elsewhere classified (R26.2);Pain Pain - Right/Left: Right Pain - part of body: Knee     Time: [id number] PT Time Calculation (min) (ACUTE ONLY): 38 min  Charges:  $Gait Training: 8-22 mins $Therapeutic Exercise: 8-22 mins $Therapeutic Activity: 8-22 mins(Toilet transfer)                    G Codes:       Alva GarnetPatrick Collen Hostler PT, DPT, CSCS    07/25/2017, 12:46 PM

## 2017-07-25 NOTE — Progress Notes (Signed)
[facility] for oxycodone that Dr. Ernest PineHooten wrote.  It was not dated and she needed verification of when the script was written, which was today per Jared CombsAmanda Boyd. Advised pharmacist of info.

## 2017-07-25 NOTE — Progress Notes (Signed)
   Subjective: 2 Days Post-Op Procedure(s) (LRB): KNEE ARTHROTOMY WITH POLY EXCHANGE (Right) Patient reports pain as 2 on 0-10 scale.   Patient is well, and has had no acute complaints or problems Vision progressing very well with physical therapy. Plan is to go Home after hospital stay. no nausea and no vomiting Patient denies any chest pains or shortness of breath. Patient denied any fevers or chills.  States that he did have some on his last visit. Patient feels that he is doing well and voicing no complaints. Objective: Vital signs in last 24 hours: Temp:  [97.7 F (36.5 C)-97.8 F (36.6 C)] 97.8 F (36.6 C) (06/29 0025) Pulse Rate:  [73-74] 73 (06/29 0025) Resp:  [16] 16 (06/29 0025) BP: (114)/(70-73) 114/73 (06/29 0025) SpO2:  [96 %-99 %] 96 % (06/29 0025) well approximated incision Heels are non tender and elevated off the bed using rolled towels Intake/Output from previous day: 06/28 0701 - 06/29 0700 In: 1870 [P.O.:720; I.V.:750; IV Piggyback:400] Out: 100 [Urine:100] Intake/Output this shift: No intake/output data recorded.  Recent Labs    07/23/17 1211 07/24/17 0627 07/25/17 0528  HGB 16.8 13.5 13.9   Recent Labs    07/24/17 0627 07/25/17 0528  WBC 11.4* 11.6*  RBC 4.10* 4.35*  HCT 39.2* 41.9  PLT 251 276   Recent Labs    07/24/17 0627 07/25/17 0528  NA 136 139  K 3.7 3.7  CL 106 109  CO2 21* 22  BUN 21 18  CREATININE 0.79 0.84  GLUCOSE 199* 108*  CALCIUM 8.7* 8.7*   Recent Labs    07/23/17 1211  INR 1.07    EXAM General - Patient is Alert, Appropriate and Oriented Extremity - Neurologically intact Neurovascular intact Sensation intact distally Intact pulses distally Dorsiflexion/Plantar flexion intact Compartment soft  Patient is noted to have some mild erythematous tissue as well as bruising diffusely to the knee but more medially. Dressing - dressing C/D/I Motor Function - intact, moving foot and toes well on exam.    Past  Medical History:  Diagnosis Date  . Anxiety   . Arthritis 06/2017   osteoarthritis of lower extremities  . Depression   . Hypercholesteremia   . Neuromuscular disorder (HCC)    neuralgia, neuritis and radiculitis    Assessment/Plan: 2 Days Post-Op Procedure(s) (LRB): KNEE ARTHROTOMY WITH POLY EXCHANGE (Right) Active Problems:   Septic joint of right knee joint (HCC)  Estimated body mass index is 27.12 kg/m as calculated from the following:   Height as of this encounter: 5\' 10"  (1.778 m).   Weight as of this encounter: 85.7 kg (189 lb). Up with therapy Discharge home with home health  Labs: No growth on cultures 24 hours.  No results yet at 48 hours. DVT Prophylaxis - Lovenox, TED hose and SCD Weight-Bearing as tolerated to right leg Plan to discharge patient after physical therapy this morning.  Will need to go home on 10 days of antibiotic prophylactically.  Lynnda ShieldsJon R. [facility] NortheastWolfe PA [facility] 07/25/2017, 7:21 AM

## 2017-07-25 NOTE — Progress Notes (Signed)
ORTHOPAEDICS: CONTACTED BY THE NURSE THAT THERE WAS NO PRESCRIPTION FOR PAIN MEDICATIONS FOR DISCHARGE. I CAME BACK TO THE HOSPITAL, QUERIED THE [place] CONTROLLED SUBSTANCE SITE.  RX FOR OXYCODONE 5MG , 1-2 TAB PO PRN SEVERE PAIN, #60  James P. Angie FavaHooten, Jr. M.D.

## 2017-07-25 NOTE — Plan of Care (Signed)
  Problem: Education: Goal: Knowledge of General Education information will improve Outcome: Progressing   Problem: Health Behavior/Discharge Planning: Goal: Ability to manage health-related needs will improve Outcome: Progressing   Problem: Clinical Measurements: Goal: Ability to maintain clinical measurements within normal limits will improve Outcome: Progressing Goal: Will remain free from infection Outcome: Progressing Goal: Diagnostic test results will improve Outcome: Progressing Goal: Respiratory complications will improve Outcome: Progressing Goal: Cardiovascular complication will be avoided Outcome: Progressing   Problem: Coping: Goal: Level of anxiety will decrease Outcome: Progressing   Problem: Nutrition: Goal: Adequate nutrition will be maintained Outcome: Progressing   Problem: Elimination: Goal: Will not experience complications related to bowel motility Outcome: Progressing Goal: Will not experience complications related to urinary retention Outcome: Progressing   Problem: Pain Managment: Goal: General experience of comfort will improve Outcome: Progressing

## 2017-07-28 LAB — AEROBIC/ANAEROBIC CULTURE (SURGICAL/DEEP WOUND): CULTURE: NO GROWTH

## 2017-07-28 NOTE — Discharge Summary (Addendum)
Physician Discharge Summary  Patient ID: Jared KapurEarl H Boston Jr. MRN: [id number] DOB/AGE: 1956-01-19 62 y.o.  Admit date: 07/23/2017 Discharge date: 07/25/17  Admission Diagnoses:  status post right partial knee replacement  Possible septic joint status post right partial knee replacement  Discharge Diagnoses: Patient Active Problem List   Diagnosis Date Noted  . Septic joint of right knee joint (HCC) 07/23/2017  . Status post right partial knee replacement 07/16/2017  Painful hemarthrosis status post right partial knee replacement.  Past Medical History:  Diagnosis Date  . Anxiety   . Arthritis 06/2017   osteoarthritis of lower extremities  . Depression   . Hypercholesteremia   . Neuromuscular disorder (HCC)    neuralgia, neuritis and radiculitis     Transfusion: None.   Consultants (if any):   Discharged Condition: Improved  Hospital Course: Jared Kapurarl H Chiu Jr. is an 62 y.o. male who was admitted 07/23/2017 with a diagnosis of a possible septic joint status post right partial knee replacement and went to the operating room on 07/23/2017 and underwent the above named procedures.    Surgeries: Procedure(s): KNEE ARTHROTOMY WITH POLY EXCHANGE on 07/23/2017 Patient tolerated the surgery well. Taken to PACU where she was stabilized and then transferred to the orthopedic floor.  Continued on Lovenox 40mg  q 24 hrs. Foot pumps applied bilaterally at 80 mm. Heels elevated on bed with rolled towels. No evidence of DVT. Negative Homan. Physical therapy started on day #1 for gait training and transfer. OT started day #1 for ADL and assisted devices.  Started on IV Vanc following surgery.  Patient's IV was d/c on POD2, No hemovac was used and Foley was removed shortly following surgery.  Implants: Small 4 mm meniscal bearing for [employer] UKA system.  He was given perioperative antibiotics:  Anti-infectives (From admission, onward)   Start     Dose/Rate Route Frequency Ordered Stop    07/24/17 0000  sulfamethoxazole-trimethoprim (BACTRIM DS,SEPTRA DS) 800-160 MG tablet     1 tablet Oral 2 times daily 07/24/17 0813     07/23/17 2000  vancomycin (VANCOCIN) IVPB 1000 mg/200 mL premix  Status:  Discontinued     1,000 mg 200 mL/hr over 60 Minutes Intravenous Every 8 hours 07/23/17 1716 07/25/17 1619   07/23/17 1330  vancomycin (VANCOCIN) 1,000 mg in sodium chloride 0.9 % 250 mL IVPB  Status:  Discontinued     1,000 mg 250 mL/hr over 60 Minutes Intravenous  Once 07/23/17 1320 07/23/17 1906   07/23/17 1247  vancomycin (VANCOCIN) 1-5 GM/200ML-% IVPB    Note to Pharmacy:  Rayann HemanKizziah, Kaitlin   : cabinet override      07/23/17 1247 07/23/17 1301   07/23/17 1145  ceFAZolin (ANCEF) IVPB 2g/100 mL premix  Status:  Discontinued     2 g 200 mL/hr over 30 Minutes Intravenous  Once 07/23/17 1139 07/23/17 1706    .  He was given sequential compression devices, early ambulation, and Lovenox for DVT prophylaxis.  He benefited maximally from the hospital stay and there were no complications.    Recent vital signs:  Vitals:   07/25/17 0025 07/25/17 0757  BP: 114/73 127/87  Pulse: 73 72  Resp: 16 18  Temp: 97.8 F (36.6 C) 97.8 F (36.6 C)  SpO2: 96% 97%    Recent laboratory studies:  Lab Results  Component Value Date   HGB 13.9 07/25/2017   HGB 13.5 07/24/2017   HGB 16.8 07/23/2017   Lab Results  Component Value Date   WBC  11.6 (H) 07/25/2017   PLT 276 07/25/2017   Lab Results  Component Value Date   INR 1.07 07/23/2017   Lab Results  Component Value Date   NA 139 07/25/2017   K 3.7 07/25/2017   CL 109 07/25/2017   CO2 22 07/25/2017   BUN 18 07/25/2017   CREATININE 0.84 07/25/2017   GLUCOSE 108 (H) 07/25/2017    Discharge Medications:   Allergies as of 07/25/2017   No Known Allergies     Medication List    TAKE these medications   acetaminophen 500 MG tablet Commonly known as:  TYLENOL Take 1,000 mg by mouth 3 (three) times daily.   buPROPion 150  MG 12 hr tablet Commonly known as:  WELLBUTRIN SR Take 150 mg by mouth 2 (two) times daily. 0500 & 1200   cetirizine 10 MG tablet Commonly known as:  ZYRTEC Take 10 mg by mouth daily.   chlorpheniramine 4 MG tablet Commonly known as:  CHLOR-TRIMETON Take 4 mg by mouth 2 (two) times daily. NOON & NIGHT.   COSAMIN DS PO Take 2 tablets by mouth daily. (1500mg -1200mg )   docusate sodium 100 MG capsule Commonly known as:  COLACE Take 100 mg by mouth 3 (three) times daily.   enoxaparin 40 MG/0.4ML injection Commonly known as:  LOVENOX Inject 0.4 mLs (40 mg total) into the skin daily.   Fish Oil 1000 MG Caps Take 1,000 mg by mouth daily.   folic acid 1 MG tablet Commonly known as:  FOLVITE Take 1 mg by mouth daily.   Garlic 1000 MG Caps Take 1,000 mg by mouth daily.   HYDROcodone-acetaminophen 5-325 MG tablet Commonly known as:  NORCO/VICODIN Take 1 tablet by mouth every 4 (four) hours as needed for moderate pain. What changed:  reasons to take this   LORazepam 0.5 MG tablet Commonly known as:  ATIVAN Take 0.5 tablets by mouth 3 (three) times daily.   meloxicam 15 MG tablet Commonly known as:  MOBIC Take 15 mg by mouth at bedtime.   oxyCODONE 5 MG immediate release tablet Commonly known as:  Oxy IR/ROXICODONE Take 1-2 tablets (5-10 mg total) by mouth every 4 (four) hours as needed for moderate pain (pain score 4-6).   simvastatin 40 MG tablet Commonly known as:  ZOCOR Take 40 mg by mouth at bedtime.   sulfamethoxazole-trimethoprim 800-160 MG tablet Commonly known as:  BACTRIM DS,SEPTRA DS Take 1 tablet by mouth 2 (two) times daily.   triamcinolone 55 MCG/ACT Aero nasal inhaler Commonly known as:  NASACORT Place 2 sprays into the nose daily.   Vitamin D3 2000 units Tabs Take 2,000 Units by mouth daily.   vitamin E 400 UNIT capsule Take 400 Units by mouth daily.       Diagnostic Studies: Dg Chest 2 View  Result Date: 07/10/2017 CLINICAL DATA:  Preop knee  replacement. EXAM: CHEST - 2 VIEW COMPARISON:  10/29/2006 FINDINGS: Lungs are adequately inflated and otherwise clear. Cardiomediastinal silhouette is within normal. Minimal degenerative change of the spine. Radiopaque material over a lower cervical spine disc space. IMPRESSION: No active cardiopulmonary disease. Electronically Signed   By: Elberta Fortis M.D.   On: 07/10/2017 11:09   Dg Knee Right Port  Result Date: 07/16/2017 CLINICAL DATA:  Partial knee replacement EXAM: PORTABLE RIGHT KNEE - 1-2 VIEW COMPARISON:  May 05, 2017 FINDINGS: Frontal and lateral views were obtained. There has been knee replacement medially with prosthetic components well-seated. There is a fracture of the inferior patella with slight  displacement of fracture fragments in this area. This finding was present on previous study with degree of displacement marginally increased at this time compared to prior study. No other fracture. No dislocation. There is a joint effusion fat fluid level in the suprapatellar region. There are skin staples anteriorly. There is soft tissue air medially, consistent with recent surgery. IMPRESSION: 1. Medial compartment knee replacement with prosthetic components well-seated. 2. Mildly displaced fracture inferior patella with slight increase in fracture fragment displacement inferiorly compared to preoperative study from April 2019. Fat-fluid level in the suprapatellar bursa. 3.  No new fracture.  No dislocation. Electronically Signed   By: Bretta Bang III M.D.   On: 07/16/2017 14:13   Disposition: Patient was discharged home on 06/2917 on 10 days of Septra.  Cultures continued to demonstrate no growth.  Discharge Instructions    Increase activity slowly   Complete by:  As directed       Signed: Meriel Pica PA-C 07/28/2017, 3:42 PM

## 2019-03-07 DIAGNOSIS — L578 Other skin changes due to chronic exposure to nonionizing radiation: Secondary | ICD-10-CM | POA: Diagnosis not present

## 2019-03-07 DIAGNOSIS — L820 Inflamed seborrheic keratosis: Secondary | ICD-10-CM | POA: Diagnosis not present

## 2019-03-07 DIAGNOSIS — L821 Other seborrheic keratosis: Secondary | ICD-10-CM | POA: Diagnosis not present

## 2019-03-30 DIAGNOSIS — Z87891 Personal history of nicotine dependence: Secondary | ICD-10-CM | POA: Diagnosis not present

## 2019-03-30 DIAGNOSIS — E785 Hyperlipidemia, unspecified: Secondary | ICD-10-CM | POA: Diagnosis not present

## 2019-03-30 DIAGNOSIS — Z79899 Other long term (current) drug therapy: Secondary | ICD-10-CM | POA: Diagnosis not present

## 2019-03-30 DIAGNOSIS — Z0000 Encounter for general adult medical examination without abnormal findings: Secondary | ICD-10-CM | POA: Diagnosis not present

## 2019-03-30 DIAGNOSIS — F419 Anxiety disorder, unspecified: Secondary | ICD-10-CM | POA: Diagnosis not present

## 2019-03-30 DIAGNOSIS — M545 Low back pain: Secondary | ICD-10-CM | POA: Diagnosis not present

## 2019-03-30 DIAGNOSIS — F329 Major depressive disorder, single episode, unspecified: Secondary | ICD-10-CM | POA: Diagnosis not present

## 2019-04-13 DIAGNOSIS — Z01818 Encounter for other preprocedural examination: Secondary | ICD-10-CM | POA: Diagnosis not present

## 2019-04-13 DIAGNOSIS — Z0000 Encounter for general adult medical examination without abnormal findings: Secondary | ICD-10-CM | POA: Diagnosis not present

## 2019-04-18 DIAGNOSIS — K644 Residual hemorrhoidal skin tags: Secondary | ICD-10-CM | POA: Diagnosis not present

## 2019-04-18 DIAGNOSIS — K640 First degree hemorrhoids: Secondary | ICD-10-CM | POA: Diagnosis not present

## 2019-04-18 DIAGNOSIS — Z1211 Encounter for screening for malignant neoplasm of colon: Secondary | ICD-10-CM | POA: Diagnosis not present

## 2019-06-06 ENCOUNTER — Other Ambulatory Visit: Payer: Self-pay

## 2019-06-06 ENCOUNTER — Ambulatory Visit: Payer: Medicare HMO | Admitting: Dermatology

## 2019-06-06 DIAGNOSIS — L918 Other hypertrophic disorders of the skin: Secondary | ICD-10-CM

## 2019-06-06 DIAGNOSIS — L820 Inflamed seborrheic keratosis: Secondary | ICD-10-CM

## 2019-06-06 DIAGNOSIS — L578 Other skin changes due to chronic exposure to nonionizing radiation: Secondary | ICD-10-CM

## 2019-06-06 DIAGNOSIS — L570 Actinic keratosis: Secondary | ICD-10-CM

## 2019-06-06 DIAGNOSIS — L821 Other seborrheic keratosis: Secondary | ICD-10-CM

## 2019-06-06 NOTE — Progress Notes (Addendum)
   Follow-Up Visit   Subjective  Jared Boyd. is a 65 y.o. male who presents for the following: ISK recheck (of face, trunk, extremities - patient has noticed other skin lesions that are irritated).  The following portions of the chart were reviewed this encounter and updated as appropriate:  Tobacco  Allergies  Meds  Problems  Med Hx  Surg Hx  Fam Hx     Review of Systems:  No other skin or systemic complaints except as noted in HPI or Assessment and Plan.  Objective  Well appearing patient in no apparent distress; mood and affect are within normal limits.  A focused examination was performed including face, trunk, extremities. Relevant physical exam findings are noted in the Assessment and Plan.  Objective  Nose: Erythematous thin papules/macules with gritty scale.   Objective  L lower eyelid x 1, arms, hands, scalp, face (16): Erythematous keratotic or waxy stuck-on papule or plaque.    Assessment & Plan  AK (actinic keratosis) Nose  Destruction of lesion - Nose Complexity: simple   Destruction method: cryotherapy   Informed consent: discussed and consent obtained   Timeout:  patient name, date of birth, surgical site, and procedure verified Lesion destroyed using liquid nitrogen: Yes   Region frozen until ice ball extended beyond lesion: Yes   Outcome: patient tolerated procedure well with no complications   Post-procedure details: wound care instructions given    Inflamed seborrheic keratosis (17) L lower eyelid x 1; arms, hands, scalp, face (16)  Destruction of lesion - arms, hands, scalp, face Complexity: simple   Destruction method: cryotherapy   Informed consent: discussed and consent obtained   Timeout:  patient name, date of birth, surgical site, and procedure verified Lesion destroyed using liquid nitrogen: Yes   Region frozen until ice ball extended beyond lesion: Yes   Outcome: patient tolerated procedure well with no complications     Post-procedure details: wound care instructions given     Seborrheic Keratoses - Stuck-on, waxy, tan-brown papules and plaques  - Discussed benign etiology and prognosis. - Observe - Call for any changes  Actinic Damage - diffuse scaly erythematous macules with underlying dyspigmentation - Recommend daily broad spectrum sunscreen SPF 30+ to sun-exposed areas, reapply every 2 hours as needed.  - Call for new or changing lesions.  Acrochordons (Skin Tags) - Fleshy, skin-colored pedunculated papules - Benign appearing.  - Observe. - If desired, they can be removed with an in office procedure that is not covered by insurance. - Please call the clinic if you notice any new or changing lesions.  Return in about 1 year (around 06/05/2020) for TBSE.  Maylene Roes, CMA, am acting as scribe for Armida Sans, MD .  Documentation: I have reviewed the above documentation for accuracy and completeness, and I agree with the above.  Armida Sans, MD

## 2019-06-07 ENCOUNTER — Encounter: Payer: Self-pay | Admitting: Dermatology
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
## 2019-10-20 IMAGING — CR DG CHEST 2V
1 series · 2 of 2 positions shown · non-contrast
Comparison: 10/29/2006

CLINICAL DATA: Preop knee replacement.

EXAM:
CHEST - 2 VIEW

[Series 1: dg chest 2 view · 0.14mm/px · 2 of 2 slices shown]
[im 1/2]
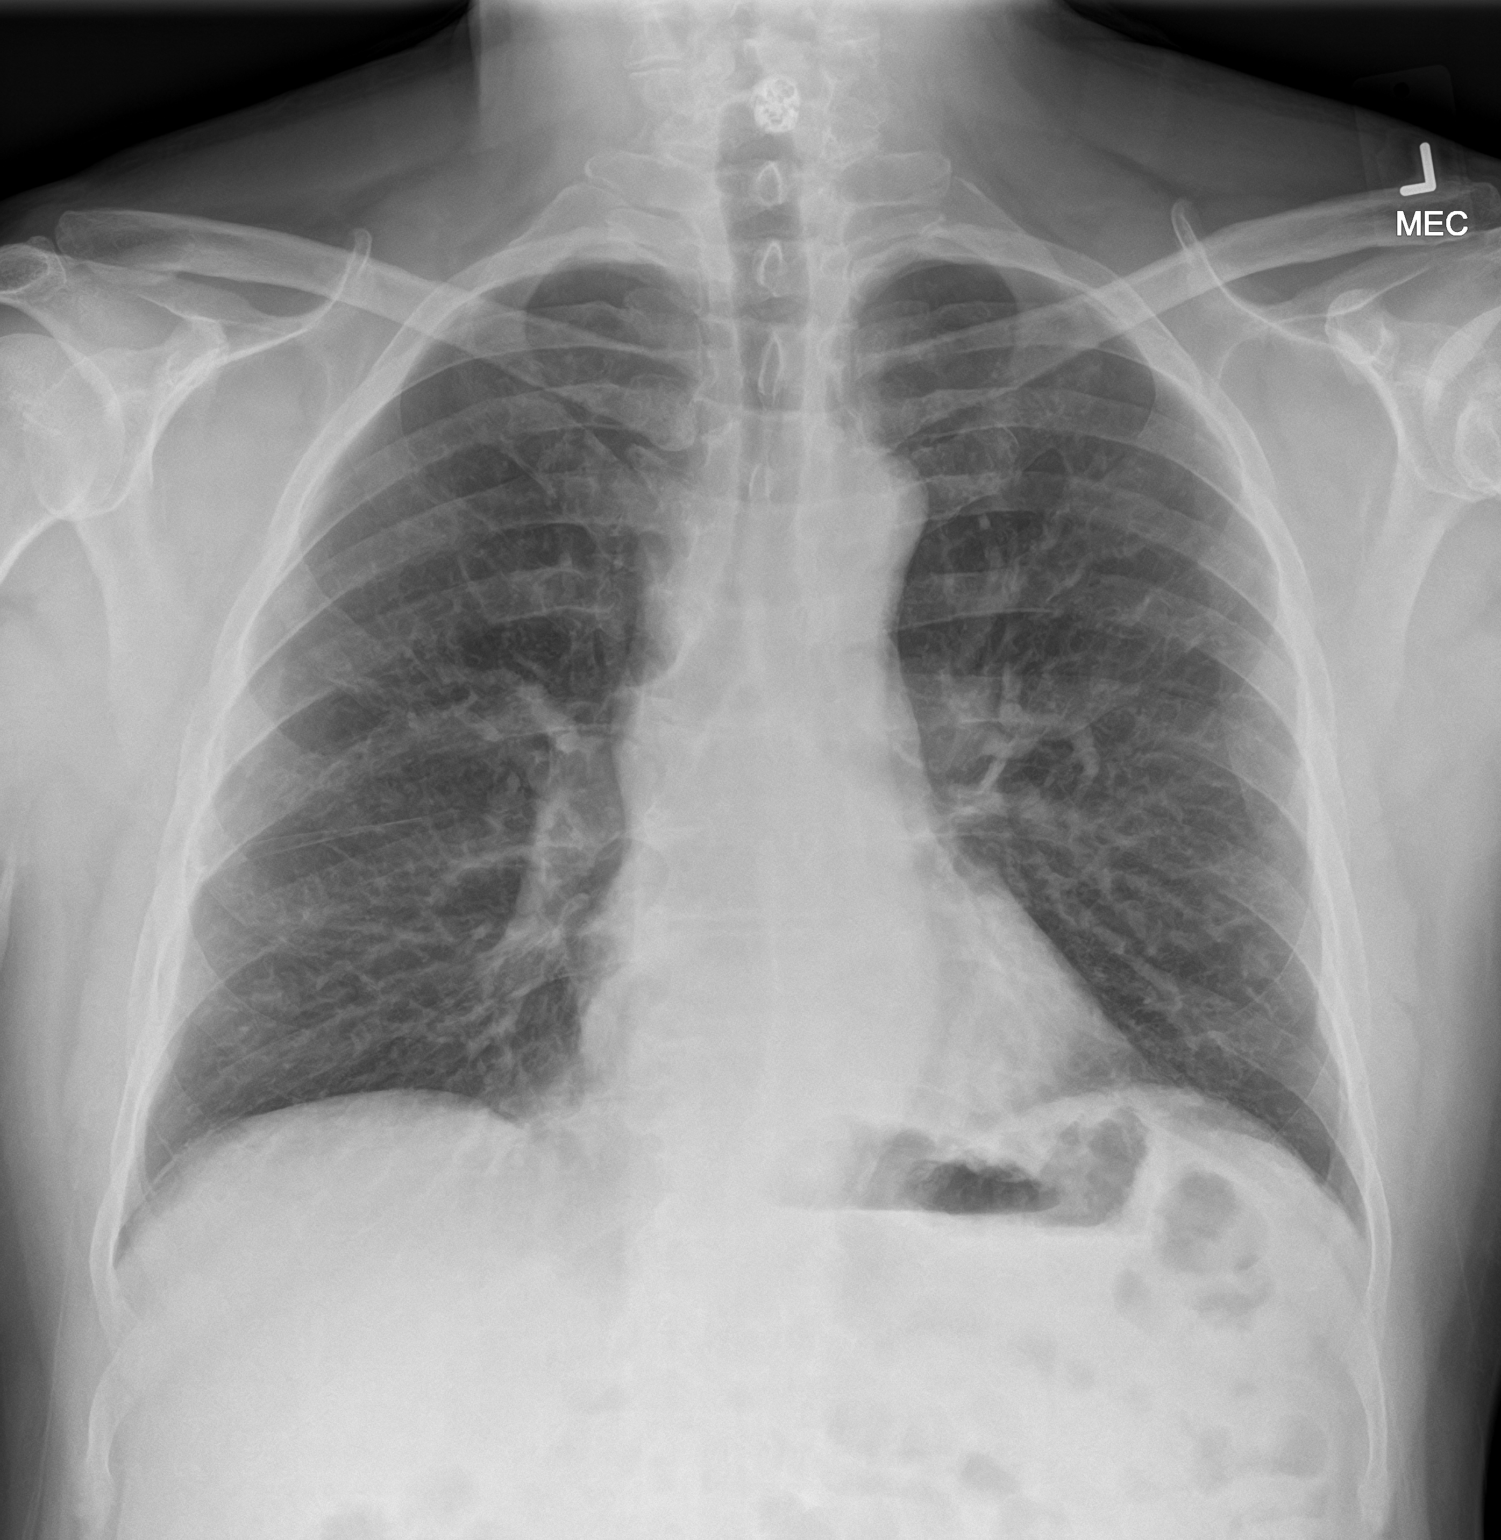
[im 2/2]
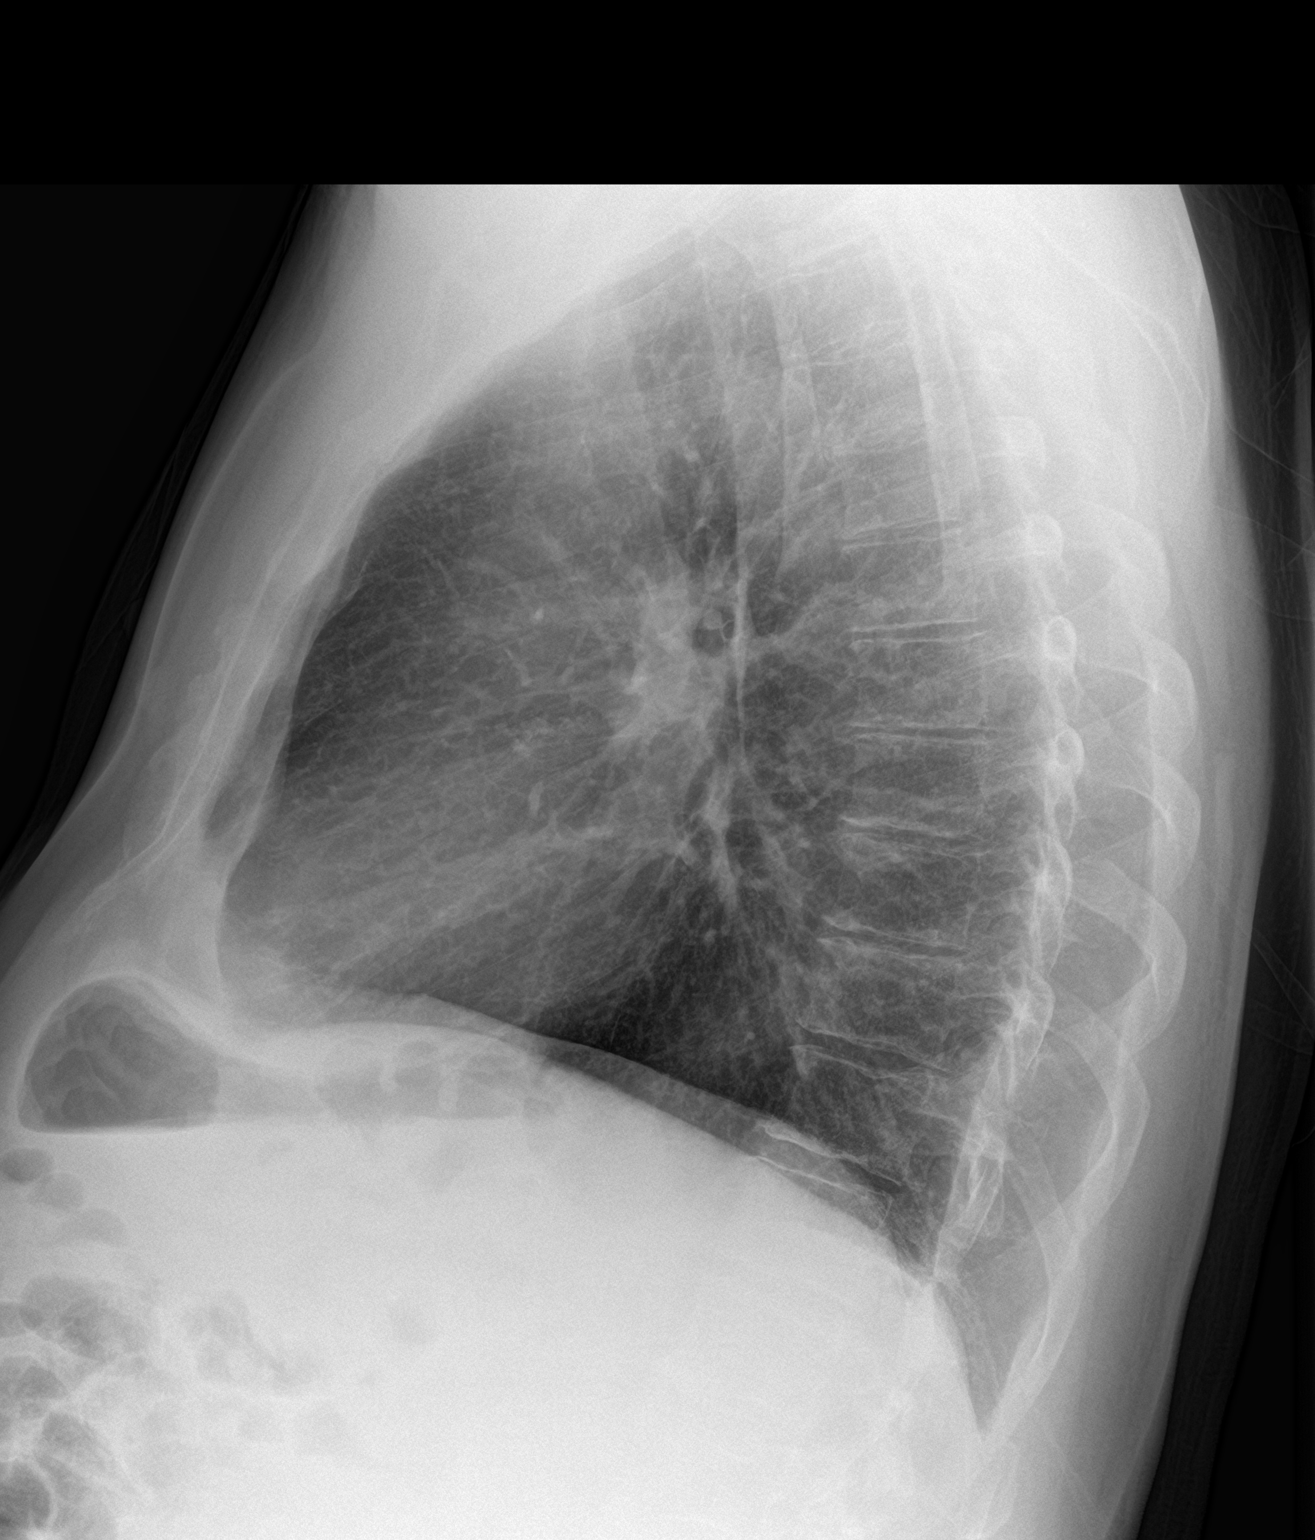

[2 of 2 positions shown; findings below may reference images not displayed]

FINDINGS: Lungs are adequately inflated and otherwise clear. Cardiomediastinal
silhouette is within normal. Minimal degenerative change of the
spine. Radiopaque material over a lower cervical spine disc space.
IMPRESSION: No active cardiopulmonary disease.

## 2019-11-03 DIAGNOSIS — G8929 Other chronic pain: Secondary | ICD-10-CM | POA: Diagnosis not present

## 2019-11-03 DIAGNOSIS — M2550 Pain in unspecified joint: Secondary | ICD-10-CM | POA: Diagnosis not present

## 2019-11-03 DIAGNOSIS — M1990 Unspecified osteoarthritis, unspecified site: Secondary | ICD-10-CM | POA: Diagnosis not present

## 2019-11-03 DIAGNOSIS — H547 Unspecified visual loss: Secondary | ICD-10-CM | POA: Diagnosis not present

## 2019-11-15 DIAGNOSIS — Z20822 Contact with and (suspected) exposure to covid-19: Secondary | ICD-10-CM | POA: Diagnosis not present

## 2019-11-25 DIAGNOSIS — Z20822 Contact with and (suspected) exposure to covid-19: Secondary | ICD-10-CM | POA: Diagnosis not present

## 2019-12-26 DIAGNOSIS — J069 Acute upper respiratory infection, unspecified: Secondary | ICD-10-CM | POA: Diagnosis not present

## 2020-01-25 DIAGNOSIS — Z0100 Encounter for examination of eyes and vision without abnormal findings: Secondary | ICD-10-CM | POA: Diagnosis not present

## 2020-03-14 DIAGNOSIS — Z03818 Encounter for observation for suspected exposure to other biological agents ruled out: Secondary | ICD-10-CM | POA: Diagnosis not present

## 2020-03-14 DIAGNOSIS — J0100 Acute maxillary sinusitis, unspecified: Secondary | ICD-10-CM | POA: Diagnosis not present

## 2020-04-02 DIAGNOSIS — F3340 Major depressive disorder, recurrent, in remission, unspecified: Secondary | ICD-10-CM | POA: Diagnosis not present

## 2020-04-02 DIAGNOSIS — G8929 Other chronic pain: Secondary | ICD-10-CM | POA: Diagnosis not present

## 2020-04-02 DIAGNOSIS — E785 Hyperlipidemia, unspecified: Secondary | ICD-10-CM | POA: Diagnosis not present

## 2020-04-02 DIAGNOSIS — Z0000 Encounter for general adult medical examination without abnormal findings: Secondary | ICD-10-CM | POA: Diagnosis not present

## 2020-04-02 DIAGNOSIS — M2550 Pain in unspecified joint: Secondary | ICD-10-CM | POA: Diagnosis not present

## 2020-05-01 DIAGNOSIS — G8929 Other chronic pain: Secondary | ICD-10-CM | POA: Diagnosis not present

## 2020-05-01 DIAGNOSIS — M5442 Lumbago with sciatica, left side: Secondary | ICD-10-CM | POA: Diagnosis not present

## 2020-05-01 DIAGNOSIS — M5136 Other intervertebral disc degeneration, lumbar region: Secondary | ICD-10-CM | POA: Diagnosis not present

## 2020-05-12 DIAGNOSIS — H2513 Age-related nuclear cataract, bilateral: Secondary | ICD-10-CM | POA: Diagnosis not present

## 2020-05-12 DIAGNOSIS — H40033 Anatomical narrow angle, bilateral: Secondary | ICD-10-CM | POA: Diagnosis not present

## 2020-05-17 DIAGNOSIS — J40 Bronchitis, not specified as acute or chronic: Secondary | ICD-10-CM | POA: Diagnosis not present

## 2020-06-06 ENCOUNTER — Encounter: Payer: Medicare HMO | Admitting: Dermatology

## 2020-06-06 DIAGNOSIS — J069 Acute upper respiratory infection, unspecified: Secondary | ICD-10-CM | POA: Diagnosis not present

## 2020-06-20 ENCOUNTER — Other Ambulatory Visit: Payer: Self-pay | Admitting: Orthopedic Surgery

## 2020-06-20 DIAGNOSIS — G8929 Other chronic pain: Secondary | ICD-10-CM

## 2020-06-20 DIAGNOSIS — M5136 Other intervertebral disc degeneration, lumbar region: Secondary | ICD-10-CM

## 2020-06-20 DIAGNOSIS — M5442 Lumbago with sciatica, left side: Secondary | ICD-10-CM | POA: Diagnosis not present

## 2020-07-04 ENCOUNTER — Other Ambulatory Visit: Payer: Self-pay

## 2020-07-04 ENCOUNTER — Ambulatory Visit
Admission: RE | Admit: 2020-07-04 | Discharge: 2020-07-04 | Disposition: A | Discharge status: Home / Self Care | Payer: Medicare HMO | Source: Ambulatory Visit | Attending: Orthopedic Surgery | Admitting: Orthopedic Surgery

## 2020-07-04 DIAGNOSIS — M5442 Lumbago with sciatica, left side: Secondary | ICD-10-CM | POA: Diagnosis not present

## 2020-07-04 DIAGNOSIS — M5136 Other intervertebral disc degeneration, lumbar region: Secondary | ICD-10-CM

## 2020-07-04 DIAGNOSIS — G8929 Other chronic pain: Secondary | ICD-10-CM | POA: Diagnosis not present

## 2020-07-04 DIAGNOSIS — M545 Low back pain, unspecified: Secondary | ICD-10-CM | POA: Diagnosis not present

## 2020-07-31 DIAGNOSIS — M48062 Spinal stenosis, lumbar region with neurogenic claudication: Secondary | ICD-10-CM | POA: Diagnosis not present

## 2020-07-31 DIAGNOSIS — M5442 Lumbago with sciatica, left side: Secondary | ICD-10-CM | POA: Diagnosis not present

## 2020-07-31 DIAGNOSIS — G8929 Other chronic pain: Secondary | ICD-10-CM | POA: Diagnosis not present

## 2020-08-02 DIAGNOSIS — M48062 Spinal stenosis, lumbar region with neurogenic claudication: Secondary | ICD-10-CM | POA: Diagnosis not present

## 2020-08-15 DIAGNOSIS — M5442 Lumbago with sciatica, left side: Secondary | ICD-10-CM | POA: Diagnosis not present

## 2020-08-15 DIAGNOSIS — G8929 Other chronic pain: Secondary | ICD-10-CM | POA: Diagnosis not present

## 2020-08-15 DIAGNOSIS — M48062 Spinal stenosis, lumbar region with neurogenic claudication: Secondary | ICD-10-CM | POA: Diagnosis not present

## 2020-10-08 DIAGNOSIS — Z125 Encounter for screening for malignant neoplasm of prostate: Secondary | ICD-10-CM | POA: Diagnosis not present

## 2020-10-08 DIAGNOSIS — E785 Hyperlipidemia, unspecified: Secondary | ICD-10-CM | POA: Diagnosis not present

## 2020-10-08 DIAGNOSIS — I10 Essential (primary) hypertension: Secondary | ICD-10-CM | POA: Diagnosis not present

## 2020-10-08 DIAGNOSIS — F32A Depression, unspecified: Secondary | ICD-10-CM | POA: Diagnosis not present

## 2020-10-08 DIAGNOSIS — N529 Male erectile dysfunction, unspecified: Secondary | ICD-10-CM | POA: Diagnosis not present

## 2020-10-08 DIAGNOSIS — G8929 Other chronic pain: Secondary | ICD-10-CM | POA: Diagnosis not present

## 2020-10-08 DIAGNOSIS — M545 Low back pain, unspecified: Secondary | ICD-10-CM | POA: Diagnosis not present

## 2020-11-14 DIAGNOSIS — Z20822 Contact with and (suspected) exposure to covid-19: Secondary | ICD-10-CM | POA: Diagnosis not present

## 2021-02-19 DIAGNOSIS — M48061 Spinal stenosis, lumbar region without neurogenic claudication: Secondary | ICD-10-CM | POA: Diagnosis not present

## 2021-02-19 DIAGNOSIS — M5136 Other intervertebral disc degeneration, lumbar region: Secondary | ICD-10-CM | POA: Diagnosis not present

## 2021-03-06 DIAGNOSIS — R42 Dizziness and giddiness: Secondary | ICD-10-CM | POA: Diagnosis not present

## 2021-04-08 DIAGNOSIS — N401 Enlarged prostate with lower urinary tract symptoms: Secondary | ICD-10-CM | POA: Diagnosis not present

## 2021-04-08 DIAGNOSIS — Z23 Encounter for immunization: Secondary | ICD-10-CM | POA: Diagnosis not present

## 2021-04-08 DIAGNOSIS — R3912 Poor urinary stream: Secondary | ICD-10-CM | POA: Diagnosis not present

## 2021-04-08 DIAGNOSIS — H6123 Impacted cerumen, bilateral: Secondary | ICD-10-CM | POA: Diagnosis not present

## 2021-04-08 DIAGNOSIS — Z0000 Encounter for general adult medical examination without abnormal findings: Secondary | ICD-10-CM | POA: Diagnosis not present

## 2021-04-08 DIAGNOSIS — Z1389 Encounter for screening for other disorder: Secondary | ICD-10-CM | POA: Diagnosis not present

## 2021-04-19 DIAGNOSIS — M48062 Spinal stenosis, lumbar region with neurogenic claudication: Secondary | ICD-10-CM | POA: Diagnosis not present

## 2021-05-07 DIAGNOSIS — M48062 Spinal stenosis, lumbar region with neurogenic claudication: Secondary | ICD-10-CM | POA: Diagnosis not present

## 2021-05-07 DIAGNOSIS — G8929 Other chronic pain: Secondary | ICD-10-CM | POA: Diagnosis not present

## 2021-05-07 DIAGNOSIS — M5441 Lumbago with sciatica, right side: Secondary | ICD-10-CM | POA: Diagnosis not present

## 2021-05-07 DIAGNOSIS — M5442 Lumbago with sciatica, left side: Secondary | ICD-10-CM | POA: Diagnosis not present

## 2021-05-13 DIAGNOSIS — M48062 Spinal stenosis, lumbar region with neurogenic claudication: Secondary | ICD-10-CM | POA: Diagnosis not present

## 2021-06-19 DIAGNOSIS — J069 Acute upper respiratory infection, unspecified: Secondary | ICD-10-CM | POA: Diagnosis not present

## 2021-07-02 DIAGNOSIS — J40 Bronchitis, not specified as acute or chronic: Secondary | ICD-10-CM | POA: Diagnosis not present

## 2021-07-02 DIAGNOSIS — R053 Chronic cough: Secondary | ICD-10-CM | POA: Diagnosis not present

## 2021-07-09 DIAGNOSIS — R059 Cough, unspecified: Secondary | ICD-10-CM | POA: Diagnosis not present

## 2021-07-29 ENCOUNTER — Other Ambulatory Visit: Payer: Self-pay | Admitting: Physical Medicine & Rehabilitation

## 2021-07-29 DIAGNOSIS — G8929 Other chronic pain: Secondary | ICD-10-CM

## 2021-08-05 ENCOUNTER — Telehealth: Payer: Self-pay

## 2021-08-05 NOTE — Telephone Encounter (Signed)
Referral received from [facility] for back pain. MRIs are not scheduled until 08/08/21. He has had injections however it does mention that PT was ordered. He can see Dr. Myer Haff after the MRI once he has finished 6 weeks of PT.

## 2021-08-08 ENCOUNTER — Ambulatory Visit: Payer: Medicare HMO

## 2021-08-12 ENCOUNTER — Institutional Professional Consult (permissible substitution): Payer: Medicare HMO | Admitting: Pulmonary Disease

## 2021-08-12 NOTE — Telephone Encounter (Signed)
Appt with [facility] on 08/14/2021. He said to f/u in a few weeks.

## 2021-08-14 DIAGNOSIS — M5442 Lumbago with sciatica, left side: Secondary | ICD-10-CM | POA: Diagnosis not present

## 2021-08-14 DIAGNOSIS — G8929 Other chronic pain: Secondary | ICD-10-CM | POA: Diagnosis not present

## 2021-08-21 ENCOUNTER — Ambulatory Visit
Admission: RE | Admit: 2021-08-21 | Discharge: 2021-08-21 | Disposition: A | Discharge status: Home / Self Care | Payer: Medicare HMO | Source: Ambulatory Visit | Attending: Physical Medicine & Rehabilitation | Admitting: Physical Medicine & Rehabilitation

## 2021-08-21 DIAGNOSIS — G8929 Other chronic pain: Secondary | ICD-10-CM

## 2021-08-21 DIAGNOSIS — M48061 Spinal stenosis, lumbar region without neurogenic claudication: Secondary | ICD-10-CM | POA: Diagnosis not present

## 2021-08-21 DIAGNOSIS — M4316 Spondylolisthesis, lumbar region: Secondary | ICD-10-CM | POA: Diagnosis not present

## 2021-08-21 DIAGNOSIS — M545 Low back pain, unspecified: Secondary | ICD-10-CM | POA: Diagnosis not present

## 2021-08-23 DIAGNOSIS — M5442 Lumbago with sciatica, left side: Secondary | ICD-10-CM | POA: Diagnosis not present

## 2021-08-23 DIAGNOSIS — G8929 Other chronic pain: Secondary | ICD-10-CM | POA: Diagnosis not present

## 2021-08-26 DIAGNOSIS — M5442 Lumbago with sciatica, left side: Secondary | ICD-10-CM | POA: Diagnosis not present

## 2021-08-26 DIAGNOSIS — G8929 Other chronic pain: Secondary | ICD-10-CM | POA: Diagnosis not present

## 2021-09-02 DIAGNOSIS — G8929 Other chronic pain: Secondary | ICD-10-CM | POA: Diagnosis not present

## 2021-09-02 DIAGNOSIS — M5442 Lumbago with sciatica, left side: Secondary | ICD-10-CM | POA: Diagnosis not present

## 2021-09-02 NOTE — Telephone Encounter (Signed)
Appt with Dr.Yarbrough on 09/26/2021.

## 2021-09-06 DIAGNOSIS — J3489 Other specified disorders of nose and nasal sinuses: Secondary | ICD-10-CM | POA: Diagnosis not present

## 2021-09-06 DIAGNOSIS — Z87891 Personal history of nicotine dependence: Secondary | ICD-10-CM | POA: Diagnosis not present

## 2021-09-06 DIAGNOSIS — R058 Other specified cough: Secondary | ICD-10-CM | POA: Diagnosis not present

## 2021-09-06 DIAGNOSIS — J209 Acute bronchitis, unspecified: Secondary | ICD-10-CM | POA: Diagnosis not present

## 2021-09-06 DIAGNOSIS — R52 Pain, unspecified: Secondary | ICD-10-CM | POA: Diagnosis not present

## 2021-09-09 DIAGNOSIS — M5442 Lumbago with sciatica, left side: Secondary | ICD-10-CM | POA: Diagnosis not present

## 2021-09-09 DIAGNOSIS — G8929 Other chronic pain: Secondary | ICD-10-CM | POA: Diagnosis not present

## 2021-09-18 DIAGNOSIS — G8929 Other chronic pain: Secondary | ICD-10-CM | POA: Diagnosis not present

## 2021-09-18 DIAGNOSIS — M5442 Lumbago with sciatica, left side: Secondary | ICD-10-CM | POA: Diagnosis not present

## 2021-09-24 DIAGNOSIS — J302 Other seasonal allergic rhinitis: Secondary | ICD-10-CM | POA: Diagnosis not present

## 2021-09-24 DIAGNOSIS — R053 Chronic cough: Secondary | ICD-10-CM | POA: Diagnosis not present

## 2021-09-24 DIAGNOSIS — K219 Gastro-esophageal reflux disease without esophagitis: Secondary | ICD-10-CM | POA: Diagnosis not present

## 2021-09-24 DIAGNOSIS — R0602 Shortness of breath: Secondary | ICD-10-CM | POA: Diagnosis not present

## 2021-09-24 DIAGNOSIS — J432 Centrilobular emphysema: Secondary | ICD-10-CM | POA: Diagnosis not present

## 2021-09-25 NOTE — Progress Notes (Unsigned)
  Referring Physician:  Morales, Jennifer I, MD [address] [place],  [place] [address]  Primary Physician:  Hedrick, James, MD  History of Present Illness: 09/25/2021 [name] is here today with a chief complaint of left sided low back pain that radiates into the left hip along with constant numbness in the bilateral legs from the knee down to the feet.   He has been having pain for 2 years.  He reports dull burning and hypersensitivity below his knees bilaterally.  He also has severe pain into his left hip.  He also reports back pain.  His symptoms get worse by standing and walking.  Lifting and twisting makes his symptoms worse.  He has tried physical therapy and injections.   Bowel/Bladder Dysfunction: none  Conservative measures:  Physical therapy: has participated in at [facility] from 08/14/21 to 09/18/21 Multimodal medical therapy including regular antiinflammatories: tylenol, cymbalta, meloxicam, gabapentin, tizanidine Injections: has had epidural steroid injections 05/13/21: Bilateral S1 TF ESI 04/19/21: Bilateral L4-5 TF ESI (no relief) 08/02/20: Bilateral L4-5 TF ESI (70% improvement)  Past Surgery:  Lumbar surgery in 1998 Dr. Califf  2 neck surgeries  Jabril H Festa Jr. has no symptoms of cervical myelopathy.  The symptoms are causing a significant impact on the patient's life.   Review of Systems:  A 10 point review of systems is negative, except for the pertinent positives and negatives detailed in the HPI.  Past Medical History: Past Medical History:  Diagnosis Date   Anxiety    Arthritis 06/2017   osteoarthritis of lower extremities   Depression    Hypercholesteremia    Keratoacanthoma 02/07/2008   LEFT FOREARM: COMPATIBLE WITH KERATOACANTHOMA, BASE INVOLVED   Neuromuscular disorder (HCC)    neuralgia, neuritis and radiculitis    Past Surgical History: Past Surgical History:  Procedure Laterality Date   BACK SURGERY  1998   cervical  fx. plates & screws removed, 2005 cages in.   COLONOSCOPY, ESOPHAGOGASTRODUODENOSCOPY (EGD) AND ESOPHAGEAL DILATION     JOINT REPLACEMENT Left 2017   partial knee replacement   KNEE ARTHROTOMY Right 07/23/2017   Procedure: KNEE ARTHROTOMY WITH POLY EXCHANGE;  Surgeon: Poggi, John J, MD;  Location: [facility] ORS;  Service: Orthopedics;  Laterality: Right;   PARTIAL KNEE ARTHROPLASTY Right 07/16/2017   Procedure: UNICOMPARTMENTAL KNEE;  Surgeon: Poggi, John J, MD;  Location: [facility] ORS;  Service: Orthopedics;  Laterality: Right;   POSTERIOR LAMINECTOMY / DECOMPRESSION LUMBAR SPINE  1993   TONSILLECTOMY     as a child    Allergies: Allergies as of 09/26/2021   (No Known Allergies)    Medications: No outpatient medications have been marked as taking for the 09/26/21 encounter (Appointment) with Yarbrough, Chester, MD.    Social History: Social History   Tobacco Use   Smoking status: Former    Packs/day: 2.50    Years: 38.00    Total pack years: 95.00    Types: Cigarettes    Quit date: 2008    Years since quitting: 15.6   Smokeless tobacco: Never  Vaping Use   Vaping Use: Never used  Substance Use Topics   Alcohol use: Not Currently    Comment: 8 years clean   Drug use: Never    Comment: 8 years clean.     Family Medical History: No family history on file.  Physical Examination: There were no vitals filed for this visit.  General: Patient is well developed, well nourished, calm, collected, and in no apparent   distress. Attention to examination is appropriate.  Neck:   Supple.  Full range of motion.  Respiratory: Patient is breathing without any difficulty.   NEUROLOGICAL:     Awake, alert, oriented to person, place, and time.  Speech is clear and fluent. Fund of knowledge is appropriate.   Cranial Nerves: Pupils equal round and reactive to light.  Facial tone is symmetric.  Facial sensation is symmetric. Shoulder shrug is symmetric. Tongue protrusion is midline.  There is  no pronator drift.  ROM of spine: full.    Strength: Side Biceps Triceps Deltoid Interossei Grip Wrist Ext. Wrist Flex.  R 5 5 5 5 5 5 5  L 5 5 5 5 5 5 5   Side Iliopsoas Quads Hamstring PF DF EHL  R 5 5 5 5 5 5  L 5 5 5 5 5 5   Reflexes are 1+ and symmetric at the biceps, triceps, brachioradialis, patella and achilles.   Hoffman's is absent.  Clonus is not present.  Toes are down-going.  Bilateral upper and lower extremity sensation is intact to light touch with the exception of diminished light touch below the knees bilaterally.    No evidence of dysmetria noted.  Gait is antalgic  Medical Decision Making  Imaging: MRI L spine 08/21/2021 IMPRESSION: Moderate spinal stenosis and moderate subarticular stenosis bilaterally L3-4, similar to the prior MRI 2022   Mild spinal stenosis L4-5 has improved. Moderate subarticular stenosis L4-5 unchanged. Improvement in mild anterolisthesis L4-5.     Electronically Signed   By: Charles  Clark M.D.   On: 08/21/2021 11:13  Flexion and extension  x-rays from September 26, 2021 show 6.3 mm of anterolisthesis of L4 and L5 on flexion with minimal anterolisthesis on extension. I have personally reviewed the images and agree with the above interpretation.  Assessment and Plan: [name] is a pleasant 66 y.o. male with low back pain with bilateral sciatica with neurogenic claudication as well.  He has severe facet arthrosis at L4-5.  He has abnormal translation of L4 and L5 with flexion extension.  Thus, he has anterolisthesis of L4 and L5.  There are significant facet joint effusions.  He previously had an anterolisthesis at this level.  He has met consideration for surgical intervention.  No further conservative management is indicated.  I recommended L3-4 decompression with L4-5 lateral lumbar interbody fusion with percutaneous fixation and fusion.  I discussed the planned procedure at length with the patient, including the risks, benefits,  alternatives, and indications. The risks discussed include but are not limited to bleeding, infection, need for reoperation, spinal fluid leak, stroke, vision loss, anesthetic complication, coma, paralysis, and even death. I also described in detail that improvement was not guaranteed.  The patient expressed understanding of these risks, and asked that we proceed with surgery. I described the surgery in layman's terms, and gave ample opportunity for questions, which were answered to the best of my ability.    I spent a total of 30 minutes in face-to-face and non-face-to-face activities related to this patient's care today.  Thank you for involving me in the care of this patient.      Chester K. Yarbrough MD, MPHS Neurosurgery  

## 2021-09-26 ENCOUNTER — Encounter: Payer: Self-pay | Admitting: Neurosurgery

## 2021-09-26 ENCOUNTER — Ambulatory Visit
Admission: RE | Admit: 2021-09-26 | Discharge: 2021-09-26 | Disposition: A | Discharge status: Home / Self Care | Payer: Medicare HMO | Source: Ambulatory Visit | Attending: Neurosurgery | Admitting: Neurosurgery

## 2021-09-26 ENCOUNTER — Ambulatory Visit
Admission: RE | Admit: 2021-09-26 | Discharge: 2021-09-26 | Disposition: A | Discharge status: Home / Self Care | Payer: Medicare HMO | Attending: Neurosurgery | Admitting: Neurosurgery

## 2021-09-26 ENCOUNTER — Ambulatory Visit: Payer: Medicare HMO | Admitting: Neurosurgery

## 2021-09-26 VITALS — BP 128/80 | Ht 69.0 in | Wt 198.6 lb

## 2021-09-26 DIAGNOSIS — M5442 Lumbago with sciatica, left side: Secondary | ICD-10-CM | POA: Diagnosis not present

## 2021-09-26 DIAGNOSIS — M5441 Lumbago with sciatica, right side: Secondary | ICD-10-CM

## 2021-09-26 DIAGNOSIS — M545 Low back pain, unspecified: Secondary | ICD-10-CM | POA: Diagnosis not present

## 2021-09-26 DIAGNOSIS — M48062 Spinal stenosis, lumbar region with neurogenic claudication: Secondary | ICD-10-CM

## 2021-09-26 DIAGNOSIS — M4316 Spondylolisthesis, lumbar region: Secondary | ICD-10-CM | POA: Diagnosis not present

## 2021-09-26 DIAGNOSIS — G8929 Other chronic pain: Secondary | ICD-10-CM

## 2021-09-26 NOTE — Patient Instructions (Signed)
Please see below for information in regards to your upcoming surgery:  Planned surgery: L3-4 posterior spinal decompression, L4-5 lateral lumbar interbody fusion and posterior spinal fusion   Surgery date: 10/28/21 - you will find out your arrival time the business day before your surgery.   NSAIDS (Non-steroidal anti-inflammatory drugs): because you are having a fusion, no NSAIDS (such as ibuprofen, aleve, naproxen, meloxicam, diclofenac) for 3 months after surgery. Celebrex is an exception. Tylenol is ok because it is not an NSAID.   Pre-op appointment at [facility] Pre-admit Testing: we will call you with a date/time for this. Pre-admit testing is located on the first floor of the [facility], [address] [facility] [address], [address].   Pre-op labs may be done at your pre-op appointment. You are not required to fast for these labs.    Should you need to change your pre-op appointment, please call Pre-admit testing at [phone].     Home health physical therapy: [facility] will contact you regarding home health physical therapy for after surgery.Their number is [phone].   Because you are having a fusion: for appointments after your 2 week follow-up: please arrive at the [facility] outpatient imaging center ([address], [place]) or [facility] one hour prior to your appointment for x-rays. This applies to every appointment after your 2 week follow-up. Failure to do so may result in your appointment being rescheduled.   If you have FMLA/disability paperwork, please drop it off or fax it to [phone], attention Patty.   If you have any questions/concerns before or after surgery, you can reach [place] at [phone], or you can send a mychart message. If you have a concern after hours that cannot wait until normal business hours, you can call [phone] or [phone] and ask the answering service  to page the neurosurgeon on call.    Appointments/FMLA & disability paperwork: [place] Nurse: Royston Cowper  Medical assistant: Irving Burton Physician Assistant's: Manning Charity & Drake Leach Surgeon: Venetia Night, MD

## 2021-10-01 ENCOUNTER — Other Ambulatory Visit: Payer: Self-pay

## 2021-10-01 DIAGNOSIS — Z01818 Encounter for other preprocedural examination: Secondary | ICD-10-CM

## 2021-10-14 DIAGNOSIS — J301 Allergic rhinitis due to pollen: Secondary | ICD-10-CM | POA: Diagnosis not present

## 2021-10-14 DIAGNOSIS — K219 Gastro-esophageal reflux disease without esophagitis: Secondary | ICD-10-CM | POA: Diagnosis not present

## 2021-10-14 DIAGNOSIS — R053 Chronic cough: Secondary | ICD-10-CM | POA: Diagnosis not present

## 2021-10-16 ENCOUNTER — Other Ambulatory Visit: Payer: Self-pay

## 2021-10-16 ENCOUNTER — Encounter
Admission: RE | Admit: 2021-10-16 | Discharge: 2021-10-16 | Disposition: A | Discharge status: Home / Self Care | Payer: Medicare HMO | Source: Ambulatory Visit | Attending: Neurosurgery | Admitting: Neurosurgery

## 2021-10-16 VITALS — BP 138/88 | HR 75 | Resp 16 | Ht 69.0 in | Wt 203.5 lb

## 2021-10-16 DIAGNOSIS — Z01818 Encounter for other preprocedural examination: Secondary | ICD-10-CM | POA: Insufficient documentation

## 2021-10-16 DIAGNOSIS — Z01812 Encounter for preprocedural laboratory examination: Secondary | ICD-10-CM

## 2021-10-16 HISTORY — DX: Pneumonia, unspecified organism: J18.9

## 2021-10-16 HISTORY — DX: Other psychoactive substance abuse, uncomplicated: F19.10

## 2021-10-16 HISTORY — DX: Gastro-esophageal reflux disease without esophagitis: K21.9

## 2021-10-16 LAB — URINALYSIS, ROUTINE W REFLEX MICROSCOPIC
Bilirubin Urine: NEGATIVE
Glucose, UA: NEGATIVE mg/dL
Hgb urine dipstick: NEGATIVE
Ketones, ur: NEGATIVE mg/dL
Leukocytes,Ua: NEGATIVE
Nitrite: NEGATIVE
Protein, ur: NEGATIVE mg/dL
Specific Gravity, Urine: 1.019 (ref 1.005–1.030)
pH: 5 (ref 5.0–8.0)

## 2021-10-16 LAB — SURGICAL PCR SCREEN
MRSA, PCR: NEGATIVE
Staphylococcus aureus: POSITIVE — AB

## 2021-10-16 NOTE — Patient Instructions (Addendum)
Your procedure is scheduled on: 10/28/21 - Monday Report to the Registration Desk on the 1st floor of the [facility]. To find out your arrival time, please call [phone] between 1PM - 3PM on: 10/25/21 - Friday If your arrival time is 6:00 am, do not arrive prior to that time as the [facility] entrance doors do not open until 6:00 am.  REMEMBER: Instructions that are not followed completely may result in serious medical risk, up to and including death; or upon the discretion of your surgeon and anesthesiologist your surgery may need to be rescheduled.  Do not eat food after midnight the night before surgery.  No gum chewing, lozengers or hard candies.  You may however, drink CLEAR liquids up to 2 hours before you are scheduled to arrive for your surgery. Do not drink anything within 2 hours of your scheduled arrival time.  Clear liquids include: - water  - apple juice without pulp - gatorade (not RED colors) - black coffee or tea (Do NOT add milk or creamers to the coffee or tea) Do NOT drink anything that is not on this list.  TAKE THESE MEDICATIONS THE MORNING OF SURGERY WITH A SIP OF WATER:  - buPROPion (WELLBUTRIN SR) - LORazepam (ATIVAN)  - omeprazole (PRILOSEC) - (take one the night before and one on the morning of surgery - helps to prevent nausea after surgery.)  One week prior to surgery: Beginning 10/21/21: DO NOT resume taking for  3 months after surgery. Celebrex is an exception. Tylenol is ok because it is not an NSAID. Stop Anti-inflammatories (NSAIDS) such as Advil, Aleve, Ibuprofen, Motrin, Naproxen, Naprosyn and Aspirin based products such as Excedrin, Goodys Powder, BC Powder.  Stop beginning 10/21/21, ANY OVER THE COUNTER supplements until after surgery.  You may however, continue to take Tylenol if needed for pain up until the day of surgery.  No Alcohol for 24 hours before or after surgery.  No Smoking including e-cigarettes for 24 hours prior to  surgery.  No chewable tobacco products for at least 6 hours prior to surgery.  No nicotine patches on the day of surgery.  Do not use any "recreational" drugs for at least a week prior to your surgery.  Please be advised that the combination of cocaine and anesthesia may have negative outcomes, up to and including death. If you test positive for cocaine, your surgery will be cancelled.  On the morning of surgery brush your teeth with toothpaste and water, you may rinse your mouth with mouthwash if you wish. Do not swallow any toothpaste or mouthwash.  Use CHG Soap or wipes as directed on instruction sheet.  Do not wear jewelry, make-up, hairpins, clips or nail polish.  Do not wear lotions, powders, or perfumes.   Do not shave body from the neck down 48 hours prior to surgery just in case you cut yourself which could leave a site for infection.  Also, freshly shaved skin may become irritated if using the CHG soap.  Contact lenses, hearing aids and dentures may not be worn into surgery.  Do not bring valuables to the hospital. [facility] is not responsible for any missing/lost belongings or valuables.   Notify your doctor if there is any change in your medical condition (cold, fever, infection).  Wear comfortable clothing (specific to your surgery type) to the hospital.  After surgery, you can help prevent lung complications by doing breathing exercises.  Take deep breaths and cough every 1-2 hours. Your doctor may  order a device called an Incentive Spirometer to help you take deep breaths. When coughing or sneezing, hold a pillow firmly against your incision with both hands. This is called "splinting." Doing this helps protect your incision. It also decreases belly discomfort.  If you are being admitted to the hospital overnight, leave your suitcase in the car. After surgery it may be brought to your room.  If you are being discharged the day of surgery, you will not be allowed to  drive home. You will need a responsible adult (18 years or older) to drive you home and stay with you that night.   If you are taking public transportation, you will need to have a responsible adult (18 years or older) with you. Please confirm with your physician that it is acceptable to use public transportation.   Please call the Pre-admissions Testing Dept. at [phone] if you have any questions about these instructions.  Surgery Visitation Policy:  Patients undergoing a surgery or procedure may have two family members or support persons with them as long as the person is not COVID-19 positive or experiencing its symptoms.   Inpatient Visitation:    Visiting hours are 7 a.m. to 8 p.m. Up to four visitors are allowed at one time in a patient room, including children. The visitors may rotate out with other people during the day. One designated support person (adult) may remain overnight.

## 2021-10-17 ENCOUNTER — Telehealth: Payer: Self-pay

## 2021-10-17 DIAGNOSIS — Z01818 Encounter for other preprocedural examination: Secondary | ICD-10-CM

## 2021-10-17 NOTE — Telephone Encounter (Signed)
-----   Message from Peggyann Shoals sent at 10/17/2021  2:30 PM EDT ----- Regarding: pre-op labs Contact: [phone] #post op L3-4 PSD, L4-5 XLIF/PSF on 10/28/21 Patient's wife works at [employer] so he wants to have his blood work done there. Pre-op told him that Dr.Yarbrough's office has to give him an order. Can he pick the order up tomorrow. How will he have them send the results to pre-op or our office?

## 2021-10-17 NOTE — Telephone Encounter (Signed)
I spoke with [facility] in PAT. He said the only thing he is missing is a CBC. OK to order per Dr Izora Ribas. Order placed and signed by Dr Darreld Mclean for pt to pickup. If [facility] doesn't accept it, he will have to get it done through PAT because we do not have a [facility] account. He will have to check with [facility] on how to get the results to [place].

## 2021-10-18 NOTE — Telephone Encounter (Signed)
Patient was notified and will pick up order today. He is aware that if [facility] can not accept the order he will have to have lab work at pre-op. He will ask them how to get the results to pre-op or our office.

## 2021-10-21 DIAGNOSIS — Z01818 Encounter for other preprocedural examination: Secondary | ICD-10-CM | POA: Diagnosis not present

## 2021-10-27 MED ORDER — VANCOMYCIN HCL IN DEXTROSE 1-5 GM/200ML-% IV SOLN: 1000.0000 mg | Freq: Once | INTRAVENOUS | Status: AC

## 2021-10-27 MED ORDER — ORAL CARE MOUTH RINSE: 15.0000 mL | Freq: Once | OROMUCOSAL | Status: AC

## 2021-10-27 MED ORDER — LACTATED RINGERS IV SOLN: INTRAVENOUS | Status: DC

## 2021-10-27 MED ORDER — CEFAZOLIN SODIUM-DEXTROSE 2-4 GM/100ML-% IV SOLN
2.0000 g | Freq: Once | INTRAVENOUS | Status: AC
  Administered 2021-10-28: 2 g via INTRAVENOUS
  Filled 2021-10-27: qty 100

## 2021-10-27 MED ORDER — CHLORHEXIDINE GLUCONATE 0.12 % MT SOLN
15.0000 mL | Freq: Once | OROMUCOSAL | Status: AC
  Administered 2021-10-28: 15 mL via OROMUCOSAL

## 2021-10-28 ENCOUNTER — Inpatient Hospital Stay: Payer: Medicare HMO

## 2021-10-28 ENCOUNTER — Other Ambulatory Visit: Payer: Self-pay

## 2021-10-28 ENCOUNTER — Encounter
Admission: RE | Disposition: A | Discharge status: Home Health | Payer: Self-pay | Source: Home / Self Care | Attending: Neurosurgery

## 2021-10-28 ENCOUNTER — Inpatient Hospital Stay: Payer: Medicare HMO | Admitting: Urgent Care

## 2021-10-28 ENCOUNTER — Inpatient Hospital Stay
Admission: RE | Admit: 2021-10-28 | Discharge: 2021-10-31 | DRG: 455 | Disposition: A | Discharge status: Home Health | Payer: Medicare HMO | Attending: Neurosurgery | Admitting: Neurosurgery

## 2021-10-28 ENCOUNTER — Encounter: Payer: Self-pay | Admitting: Neurosurgery

## 2021-10-28 DIAGNOSIS — F419 Anxiety disorder, unspecified: Secondary | ICD-10-CM | POA: Diagnosis present

## 2021-10-28 DIAGNOSIS — M47896 Other spondylosis, lumbar region: Secondary | ICD-10-CM | POA: Diagnosis not present

## 2021-10-28 DIAGNOSIS — K219 Gastro-esophageal reflux disease without esophagitis: Secondary | ICD-10-CM | POA: Diagnosis present

## 2021-10-28 DIAGNOSIS — M4316 Spondylolisthesis, lumbar region: Secondary | ICD-10-CM | POA: Diagnosis not present

## 2021-10-28 DIAGNOSIS — E7800 Pure hypercholesterolemia, unspecified: Secondary | ICD-10-CM | POA: Diagnosis not present

## 2021-10-28 DIAGNOSIS — M5441 Lumbago with sciatica, right side: Secondary | ICD-10-CM | POA: Diagnosis present

## 2021-10-28 DIAGNOSIS — F32A Depression, unspecified: Secondary | ICD-10-CM | POA: Diagnosis present

## 2021-10-28 DIAGNOSIS — M48062 Spinal stenosis, lumbar region with neurogenic claudication: Principal | ICD-10-CM

## 2021-10-28 DIAGNOSIS — Z981 Arthrodesis status: Secondary | ICD-10-CM | POA: Diagnosis not present

## 2021-10-28 DIAGNOSIS — G8929 Other chronic pain: Secondary | ICD-10-CM | POA: Diagnosis not present

## 2021-10-28 DIAGNOSIS — Z96652 Presence of left artificial knee joint: Secondary | ICD-10-CM | POA: Diagnosis present

## 2021-10-28 DIAGNOSIS — Z87891 Personal history of nicotine dependence: Secondary | ICD-10-CM | POA: Diagnosis not present

## 2021-10-28 DIAGNOSIS — M5442 Lumbago with sciatica, left side: Secondary | ICD-10-CM | POA: Diagnosis present

## 2021-10-28 DIAGNOSIS — Z79899 Other long term (current) drug therapy: Secondary | ICD-10-CM | POA: Diagnosis not present

## 2021-10-28 DIAGNOSIS — M4326 Fusion of spine, lumbar region: Secondary | ICD-10-CM | POA: Diagnosis not present

## 2021-10-28 DIAGNOSIS — R29818 Other symptoms and signs involving the nervous system: Secondary | ICD-10-CM | POA: Diagnosis present

## 2021-10-28 DIAGNOSIS — Z01818 Encounter for other preprocedural examination: Secondary | ICD-10-CM

## 2021-10-28 DIAGNOSIS — Z01812 Encounter for preprocedural laboratory examination: Secondary | ICD-10-CM

## 2021-10-28 HISTORY — PX: ANTERIOR LATERAL LUMBAR FUSION WITH PERCUTANEOUS SCREW 1 LEVEL: SHX5553

## 2021-10-28 HISTORY — PX: LUMBAR LAMINECTOMY/DECOMPRESSION MICRODISCECTOMY: SHX5026

## 2021-10-28 HISTORY — PX: APPLICATION OF INTRAOPERATIVE CT SCAN: SHX6668

## 2021-10-28 LAB — BASIC METABOLIC PANEL
Anion gap: 8 (ref 5–15)
BUN: 17 mg/dL (ref 8–23)
CO2: 21 mmol/L — ABNORMAL LOW (ref 22–32)
Calcium: 9.4 mg/dL (ref 8.9–10.3)
Chloride: 109 mmol/L (ref 98–111)
Creatinine, Ser: 0.79 mg/dL (ref 0.61–1.24)
GFR, Estimated: >60 mL/min (ref >60)
Glucose, Bld: 96 mg/dL (ref 70–99)
Potassium: 3.8 mmol/L (ref 3.5–5.1)
Sodium: 138 mmol/L (ref 135–145)

## 2021-10-28 LAB — CBC
HCT: 49 % (ref 39.0–52.0)
Hemoglobin: 16.4 g/dL (ref 13.0–17.0)
MCH: 31.7 pg (ref 26.0–34.0)
MCHC: 33.5 g/dL (ref 30.0–36.0)
MCV: 94.8 fL (ref 80.0–100.0)
Platelets: 201 10*3/uL (ref 150–400)
RBC: 5.17 MIL/uL (ref 4.22–5.81)
RDW: 12.9 % (ref 11.5–15.5)
WBC: 8.1 10*3/uL (ref 4.0–10.5)
nRBC: 0 % (ref 0.0–0.2)

## 2021-10-28 LAB — TYPE AND SCREEN
ABO/RH(D): B POS
Antibody Screen: NEGATIVE

## 2021-10-28 SURGERY — LUMBAR LAMINECTOMY/DECOMPRESSION MICRODISCECTOMY 1 LEVEL
Anesthesia: General | Site: Spine Lumbar

## 2021-10-28 MED ORDER — LORAZEPAM 0.5 MG PO TABS
0.2500 mg | ORAL_TABLET | Freq: Three times a day (TID) | ORAL | Status: DC
  Administered 2021-10-28 – 2021-10-31 (×8): 0.25 mg via ORAL
  Filled 2021-10-28 (×8): qty 1

## 2021-10-28 MED ORDER — PHENYLEPHRINE HCL (PRESSORS) 10 MG/ML IV SOLN
INTRAVENOUS | Status: AC
  Filled 2021-10-28: qty 1

## 2021-10-28 MED ORDER — SODIUM CHLORIDE 0.9% FLUSH: 3.0000 mL | INTRAVENOUS | Status: DC | PRN

## 2021-10-28 MED ORDER — METHOCARBAMOL 1000 MG/10ML IJ SOLN
500.0000 mg | Freq: Four times a day (QID) | INTRAVENOUS | Status: DC | PRN
  Administered 2021-10-28: 500 mg via INTRAVENOUS
  Filled 2021-10-28 (×2): qty 5

## 2021-10-28 MED ORDER — ACETAMINOPHEN 500 MG PO TABS
1000.0000 mg | ORAL_TABLET | Freq: Once | ORAL | Status: AC
  Administered 2021-10-28: 1000 mg via ORAL

## 2021-10-28 MED ORDER — ACETAMINOPHEN 650 MG RE SUPP: 650.0000 mg | RECTAL | Status: DC | PRN

## 2021-10-28 MED ORDER — OXYCODONE HCL 5 MG PO TABS
10.0000 mg | ORAL_TABLET | ORAL | Status: DC | PRN
  Administered 2021-10-29 – 2021-10-31 (×6): 10 mg via ORAL
  Filled 2021-10-28 (×8): qty 2

## 2021-10-28 MED ORDER — FENTANYL CITRATE (PF) 100 MCG/2ML IJ SOLN
INTRAMUSCULAR | Status: AC
  Filled 2021-10-28: qty 2

## 2021-10-28 MED ORDER — PROPOFOL 1000 MG/100ML IV EMUL
INTRAVENOUS | Status: AC
  Filled 2021-10-28: qty 100

## 2021-10-28 MED ORDER — SIMVASTATIN 20 MG PO TABS
40.0000 mg | ORAL_TABLET | Freq: Every day | ORAL | Status: DC
  Administered 2021-10-28 – 2021-10-30 (×3): 40 mg via ORAL
  Filled 2021-10-28 (×3): qty 2

## 2021-10-28 MED ORDER — BUPIVACAINE-EPINEPHRINE (PF) 0.5% -1:200000 IJ SOLN
INTRAMUSCULAR | Status: AC
  Filled 2021-10-28: qty 30

## 2021-10-28 MED ORDER — ONDANSETRON HCL 4 MG PO TABS: 4.0000 mg | ORAL_TABLET | Freq: Four times a day (QID) | ORAL | Status: DC | PRN

## 2021-10-28 MED ORDER — LIDOCAINE HCL (CARDIAC) PF 100 MG/5ML IV SOSY
PREFILLED_SYRINGE | INTRAVENOUS | Status: DC | PRN
  Administered 2021-10-28: 100 mg via INTRAVENOUS

## 2021-10-28 MED ORDER — PHENOL 1.4 % MT LIQD: 1.0000 | OROMUCOSAL | Status: DC | PRN

## 2021-10-28 MED ORDER — METHYLPREDNISOLONE ACETATE 40 MG/ML IJ SUSP
INTRAMUSCULAR | Status: AC
  Filled 2021-10-28: qty 1

## 2021-10-28 MED ORDER — REMIFENTANIL HCL 1 MG IV SOLR
INTRAVENOUS | Status: DC | PRN
  Administered 2021-10-28: .1 ug/kg/min via INTRAVENOUS

## 2021-10-28 MED ORDER — CHLORHEXIDINE GLUCONATE 0.12 % MT SOLN
OROMUCOSAL | Status: AC
  Filled 2021-10-28: qty 15

## 2021-10-28 MED ORDER — ACETAMINOPHEN 500 MG PO TABS
ORAL_TABLET | ORAL | Status: AC
  Administered 2021-10-29: 650 mg via ORAL
  Filled 2021-10-28: qty 2

## 2021-10-28 MED ORDER — DROPERIDOL 2.5 MG/ML IJ SOLN: 0.6250 mg | Freq: Once | INTRAMUSCULAR | Status: DC | PRN

## 2021-10-28 MED ORDER — BUPIVACAINE LIPOSOME 1.3 % IJ SUSP
INTRAMUSCULAR | Status: AC
  Filled 2021-10-28: qty 20

## 2021-10-28 MED ORDER — SURGIFLO WITH THROMBIN (HEMOSTATIC MATRIX KIT) OPTIME
TOPICAL | Status: DC | PRN
  Administered 2021-10-28: 1 via TOPICAL

## 2021-10-28 MED ORDER — 0.9 % SODIUM CHLORIDE (POUR BTL) OPTIME
TOPICAL | Status: DC | PRN
  Administered 2021-10-28: 500 mL

## 2021-10-28 MED ORDER — OXYCODONE HCL 5 MG PO TABS: 5.0000 mg | ORAL_TABLET | Freq: Once | ORAL | Status: AC | PRN

## 2021-10-28 MED ORDER — CEFAZOLIN SODIUM-DEXTROSE 2-4 GM/100ML-% IV SOLN
INTRAVENOUS | Status: AC
  Filled 2021-10-28: qty 100

## 2021-10-28 MED ORDER — PROPOFOL 500 MG/50ML IV EMUL
INTRAVENOUS | Status: DC | PRN
  Administered 2021-10-28: 120 ug/kg/min via INTRAVENOUS

## 2021-10-28 MED ORDER — LACTATED RINGERS IV SOLN: INTRAVENOUS | Status: DC | PRN

## 2021-10-28 MED ORDER — OXYCODONE HCL 5 MG/5ML PO SOLN
ORAL | Status: AC
  Filled 2021-10-28: qty 5

## 2021-10-28 MED ORDER — KETAMINE HCL 10 MG/ML IJ SOLN
INTRAMUSCULAR | Status: DC | PRN
  Administered 2021-10-28: 10 mg via INTRAVENOUS
  Administered 2021-10-28: 20 mg via INTRAVENOUS
  Administered 2021-10-28 (×2): 10 mg via INTRAVENOUS

## 2021-10-28 MED ORDER — SODIUM CHLORIDE 0.9% FLUSH
3.0000 mL | Freq: Two times a day (BID) | INTRAVENOUS | Status: DC
  Administered 2021-10-28 – 2021-10-31 (×6): 3 mL via INTRAVENOUS

## 2021-10-28 MED ORDER — REMIFENTANIL HCL 1 MG IV SOLR
INTRAVENOUS | Status: AC
  Filled 2021-10-28: qty 1000

## 2021-10-28 MED ORDER — PANTOPRAZOLE SODIUM 40 MG PO TBEC
40.0000 mg | DELAYED_RELEASE_TABLET | Freq: Every day | ORAL | Status: DC
  Administered 2021-10-29 – 2021-10-31 (×3): 40 mg via ORAL
  Filled 2021-10-28 (×3): qty 1

## 2021-10-28 MED ORDER — KETAMINE HCL 50 MG/5ML IJ SOSY
PREFILLED_SYRINGE | INTRAMUSCULAR | Status: AC
  Filled 2021-10-28: qty 5

## 2021-10-28 MED ORDER — DEXMEDETOMIDINE HCL IN NACL 80 MCG/20ML IV SOLN
INTRAVENOUS | Status: DC | PRN
  Administered 2021-10-28: 12 ug via BUCCAL
  Administered 2021-10-28: 8 ug via BUCCAL

## 2021-10-28 MED ORDER — MENTHOL 3 MG MT LOZG: 1.0000 | LOZENGE | OROMUCOSAL | Status: DC | PRN

## 2021-10-28 MED ORDER — SODIUM CHLORIDE 0.9 % IV SOLN: INTRAVENOUS | Status: DC

## 2021-10-28 MED ORDER — MIDAZOLAM HCL 2 MG/2ML IJ SOLN
INTRAMUSCULAR | Status: AC
  Filled 2021-10-28: qty 2

## 2021-10-28 MED ORDER — DOCUSATE SODIUM 100 MG PO CAPS
100.0000 mg | ORAL_CAPSULE | Freq: Three times a day (TID) | ORAL | Status: DC
  Administered 2021-10-28 – 2021-10-31 (×8): 100 mg via ORAL
  Filled 2021-10-28 (×8): qty 1

## 2021-10-28 MED ORDER — PROMETHAZINE HCL 25 MG/ML IJ SOLN: 6.2500 mg | INTRAMUSCULAR | Status: DC | PRN

## 2021-10-28 MED ORDER — MIDAZOLAM HCL 2 MG/2ML IJ SOLN
INTRAMUSCULAR | Status: DC | PRN
  Administered 2021-10-28: 2 mg via INTRAVENOUS

## 2021-10-28 MED ORDER — FENTANYL CITRATE (PF) 100 MCG/2ML IJ SOLN
INTRAMUSCULAR | Status: DC | PRN
  Administered 2021-10-28 (×2): 50 ug via INTRAVENOUS
  Administered 2021-10-28: 100 ug via INTRAVENOUS

## 2021-10-28 MED ORDER — DEXAMETHASONE SODIUM PHOSPHATE 10 MG/ML IJ SOLN
INTRAMUSCULAR | Status: DC | PRN
  Administered 2021-10-28: 10 mg via INTRAVENOUS

## 2021-10-28 MED ORDER — FENTANYL CITRATE (PF) 100 MCG/2ML IJ SOLN
INTRAMUSCULAR | Status: AC
  Administered 2021-10-28: 50 ug via INTRAVENOUS
  Filled 2021-10-28: qty 2

## 2021-10-28 MED ORDER — ONDANSETRON HCL 4 MG/2ML IJ SOLN: 4.0000 mg | Freq: Four times a day (QID) | INTRAMUSCULAR | Status: DC | PRN

## 2021-10-28 MED ORDER — CELECOXIB 200 MG PO CAPS: 200.0000 mg | ORAL_CAPSULE | Freq: Two times a day (BID) | ORAL | Status: DC

## 2021-10-28 MED ORDER — VANCOMYCIN HCL IN DEXTROSE 1-5 GM/200ML-% IV SOLN
INTRAVENOUS | Status: AC
  Administered 2021-10-28: 1000 mg via INTRAVENOUS
  Filled 2021-10-28: qty 200

## 2021-10-28 MED ORDER — ENOXAPARIN SODIUM 40 MG/0.4ML IJ SOSY
40.0000 mg | PREFILLED_SYRINGE | INTRAMUSCULAR | Status: DC
  Administered 2021-10-29 – 2021-10-31 (×3): 40 mg via SUBCUTANEOUS
  Filled 2021-10-28 (×3): qty 0.4

## 2021-10-28 MED ORDER — GLYCOPYRROLATE PF 0.2 MG/ML IJ SOSY
PREFILLED_SYRINGE | INTRAMUSCULAR | Status: DC | PRN
  Administered 2021-10-28: .1 mg via INTRAVENOUS

## 2021-10-28 MED ORDER — BUPROPION HCL ER (SR) 150 MG PO TB12
150.0000 mg | ORAL_TABLET | Freq: Two times a day (BID) | ORAL | Status: DC
  Administered 2021-10-28 – 2021-10-31 (×6): 150 mg via ORAL
  Filled 2021-10-28 (×6): qty 1

## 2021-10-28 MED ORDER — LIDOCAINE IN D5W 4-5 MG/ML-% IV SOLN
1.0000 mg/min | INTRAVENOUS | Status: DC
  Filled 2021-10-28: qty 500

## 2021-10-28 MED ORDER — SUCCINYLCHOLINE CHLORIDE 200 MG/10ML IV SOSY
PREFILLED_SYRINGE | INTRAVENOUS | Status: DC | PRN
  Administered 2021-10-28: 100 mg via INTRAVENOUS

## 2021-10-28 MED ORDER — BUPIVACAINE-EPINEPHRINE (PF) 0.5% -1:200000 IJ SOLN
INTRAMUSCULAR | Status: DC | PRN
  Administered 2021-10-28: 5 mL
  Administered 2021-10-28: 3 mL via PERINEURAL

## 2021-10-28 MED ORDER — KETOROLAC TROMETHAMINE 15 MG/ML IJ SOLN
7.5000 mg | Freq: Four times a day (QID) | INTRAMUSCULAR | Status: AC
  Administered 2021-10-28 – 2021-10-29 (×3): 7.5 mg via INTRAVENOUS
  Filled 2021-10-28 (×3): qty 1

## 2021-10-28 MED ORDER — ONDANSETRON HCL 4 MG/2ML IJ SOLN
INTRAMUSCULAR | Status: DC | PRN
  Administered 2021-10-28: 8 mg via INTRAVENOUS

## 2021-10-28 MED ORDER — ACETAMINOPHEN 10 MG/ML IV SOLN
INTRAVENOUS | Status: AC
  Filled 2021-10-28: qty 100

## 2021-10-28 MED ORDER — SODIUM CHLORIDE FLUSH 0.9 % IV SOLN
INTRAVENOUS | Status: AC
  Filled 2021-10-28: qty 20

## 2021-10-28 MED ORDER — METHOCARBAMOL 500 MG PO TABS
500.0000 mg | ORAL_TABLET | Freq: Four times a day (QID) | ORAL | Status: DC | PRN
  Administered 2021-10-30: 500 mg via ORAL
  Filled 2021-10-28: qty 1

## 2021-10-28 MED ORDER — PROPOFOL 10 MG/ML IV BOLUS
INTRAVENOUS | Status: DC | PRN
  Administered 2021-10-28: 200 mg via INTRAVENOUS

## 2021-10-28 MED ORDER — BUPIVACAINE HCL (PF) 0.5 % IJ SOLN
INTRAMUSCULAR | Status: AC
  Filled 2021-10-28: qty 30

## 2021-10-28 MED ORDER — GLYCOPYRROLATE 0.2 MG/ML IJ SOLN
INTRAMUSCULAR | Status: AC
  Filled 2021-10-28: qty 1

## 2021-10-28 MED ORDER — FENTANYL CITRATE (PF) 100 MCG/2ML IJ SOLN
25.0000 ug | INTRAMUSCULAR | Status: DC | PRN
  Administered 2021-10-28: 50 ug via INTRAVENOUS

## 2021-10-28 MED ORDER — SODIUM CHLORIDE (PF) 0.9 % IJ SOLN
INTRAMUSCULAR | Status: DC | PRN
  Administered 2021-10-28: 60 mL

## 2021-10-28 MED ORDER — ACETAMINOPHEN 10 MG/ML IV SOLN
1000.0000 mg | Freq: Once | INTRAVENOUS | Status: DC | PRN
  Administered 2021-10-28: 1000 mg via INTRAVENOUS

## 2021-10-28 MED ORDER — OXYCODONE HCL 5 MG/5ML PO SOLN
5.0000 mg | Freq: Once | ORAL | Status: AC | PRN
  Administered 2021-10-28: 5 mg via ORAL

## 2021-10-28 MED ORDER — ACETAMINOPHEN 325 MG PO TABS
650.0000 mg | ORAL_TABLET | ORAL | Status: DC | PRN
  Administered 2021-10-29: 650 mg via ORAL
  Filled 2021-10-28 (×2): qty 2

## 2021-10-28 MED ORDER — DULOXETINE HCL 20 MG PO CPEP
20.0000 mg | ORAL_CAPSULE | Freq: Every day | ORAL | Status: DC
  Administered 2021-10-29 – 2021-10-31 (×3): 20 mg via ORAL
  Filled 2021-10-28 (×3): qty 1

## 2021-10-28 MED ORDER — MORPHINE SULFATE (PF) 2 MG/ML IV SOLN: 2.0000 mg | INTRAVENOUS | Status: DC | PRN

## 2021-10-28 MED ORDER — KETOROLAC TROMETHAMINE 15 MG/ML IJ SOLN
INTRAMUSCULAR | Status: AC
  Administered 2021-10-28: 7.5 mg via INTRAVENOUS
  Filled 2021-10-28: qty 1

## 2021-10-28 MED ORDER — OXYCODONE HCL 5 MG PO TABS
5.0000 mg | ORAL_TABLET | ORAL | Status: DC | PRN
  Administered 2021-10-29 – 2021-10-30 (×3): 5 mg via ORAL
  Filled 2021-10-28 (×2): qty 1

## 2021-10-28 MED ORDER — PHENYLEPHRINE HCL-NACL 20-0.9 MG/250ML-% IV SOLN
INTRAVENOUS | Status: DC | PRN
  Administered 2021-10-28: 15 ug/min via INTRAVENOUS

## 2021-10-28 MED ORDER — SODIUM CHLORIDE 0.9 % IV SOLN: 250.0000 mL | INTRAVENOUS | Status: DC

## 2021-10-28 SURGICAL SUPPLY — 100 items
ALLOGRAFT BONESTRIP KORE 2.5X5 (Bone Implant) IMPLANT
BASIN KIT SINGLE STR (MISCELLANEOUS) ×1 IMPLANT
BUR NEURO DRILL SOFT 3.0X3.8M (BURR) ×1 IMPLANT
CAP LOCKING SPINE (Cap) IMPLANT
CHLORAPREP W/TINT 26 (MISCELLANEOUS) ×4 IMPLANT
CNTNR SPEC 2.5X3XGRAD LEK (MISCELLANEOUS) ×1
CONT SPEC 4OZ STER OR WHT (MISCELLANEOUS) ×1
CONT SPEC 4OZ STRL OR WHT (MISCELLANEOUS) ×1
CONTAINER SPEC 2.5X3XGRAD LEK (MISCELLANEOUS) ×1 IMPLANT
CORD BIP STRL DISP 12FT (MISCELLANEOUS) ×1 IMPLANT
CORD LIGHT LATERIAL X LIFT (MISCELLANEOUS) IMPLANT
COUNTER NEEDLE 20/40 LG (NEEDLE) ×2 IMPLANT
COVER BACK TABLE REUSABLE LG (DRAPES) ×1 IMPLANT
CUP MEDICINE 2OZ PLAST GRAD ST (MISCELLANEOUS) ×1 IMPLANT
DERMABOND ADVANCED .7 DNX12 (GAUZE/BANDAGES/DRESSINGS) ×3 IMPLANT
DRAPE 3D C-ARM OEC (DRAPES) IMPLANT
DRAPE C ARM PK CFD 31 SPINE (DRAPES) ×1 IMPLANT
DRAPE C-ARMOR (DRAPES) IMPLANT
DRAPE INCISE IOBAN 66X45 STRL (DRAPES) ×1 IMPLANT
DRAPE LAPAROTOMY 100X77 ABD (DRAPES) ×2 IMPLANT
DRAPE MICROSCOPE SPINE 48X150 (DRAPES) ×1 IMPLANT
DRAPE SCAN PATIENT (DRAPES) ×1 IMPLANT
DRAPE SURG 17X11 SM STRL (DRAPES) ×2 IMPLANT
DRSG OPSITE POSTOP 3X4 (GAUZE/BANDAGES/DRESSINGS) IMPLANT
DRSG OPSITE POSTOP 4X6 (GAUZE/BANDAGES/DRESSINGS) IMPLANT
DRSG TEGADERM 2-3/8X2-3/4 SM (GAUZE/BANDAGES/DRESSINGS) IMPLANT
DRSG TEGADERM 4X4.75 (GAUZE/BANDAGES/DRESSINGS) IMPLANT
DRSG TEGADERM 6X8 (GAUZE/BANDAGES/DRESSINGS) IMPLANT
ELECT CAUTERY BLADE TIP 2.5 (TIP) ×1
ELECT EZSTD 165MM 6.5IN (MISCELLANEOUS) ×1
ELECT REM PT RETURN 9FT ADLT (ELECTROSURGICAL) ×2
ELECTRODE CAUTERY BLDE TIP 2.5 (TIP) ×1 IMPLANT
ELECTRODE EZSTD 165MM 6.5IN (MISCELLANEOUS) ×1 IMPLANT
ELECTRODE REM PT RTRN 9FT ADLT (ELECTROSURGICAL) ×2 IMPLANT
EX-PIN ORTHOLOCK NAV 4X150 (PIN) IMPLANT
FEE INTRAOP CADWELL SUPPLY NCS (MISCELLANEOUS) ×1 IMPLANT
FEE INTRAOP MONITOR IMPULS NCS (MISCELLANEOUS) ×1 IMPLANT
FORCEPS BPLR BAYO 10IN 1.0TIP (ORTHOPEDIC DISPOSABLE SUPPLIES) IMPLANT
GAUZE 4X4 16PLY ~~LOC~~+RFID DBL (SPONGE) ×1 IMPLANT
GLOVE BIOGEL PI IND STRL 6.5 (GLOVE) ×3 IMPLANT
GLOVE BIOGEL PI IND STRL 8.5 (GLOVE) ×1 IMPLANT
GLOVE SURG SYN 6.5 ES PF (GLOVE) ×5 IMPLANT
GLOVE SURG SYN 6.5 PF PI (GLOVE) ×5 IMPLANT
GLOVE SURG SYN 8.5  E (GLOVE) ×6
GLOVE SURG SYN 8.5 E (GLOVE) ×6 IMPLANT
GLOVE SURG SYN 8.5 PF PI (GLOVE) ×6 IMPLANT
GOWN SRG LRG LVL 4 IMPRV REINF (GOWNS) ×2 IMPLANT
GOWN SRG XL LVL 3 NONREINFORCE (GOWNS) ×2 IMPLANT
GOWN STRL NON-REIN TWL XL LVL3 (GOWNS) ×2
GOWN STRL REIN LRG LVL4 (GOWNS) ×2
GRADUATE 1200CC STRL 31836 (MISCELLANEOUS) ×1 IMPLANT
HEMOVAC 400CC 10FR (MISCELLANEOUS) IMPLANT
HOLDER FOLEY CATH W/STRAP (MISCELLANEOUS) ×1 IMPLANT
INTRAOP CADWELL SUPPLY FEE NCS (MISCELLANEOUS) ×1
INTRAOP DISP SUPPLY FEE NCS (MISCELLANEOUS) ×1
INTRAOP MONITOR FEE IMPULS NCS (MISCELLANEOUS) ×1
INTRAOP MONITOR FEE IMPULSE (MISCELLANEOUS) ×1
K-WIRE 1.6 NITINOL SHARP TIP (WIRE) ×4
KIT DISP MARS 3V (KITS) IMPLANT
KIT PEDICLE ACCESS (KITS) IMPLANT
KIT SPINAL PRONEVIEW (KITS) ×1 IMPLANT
KIT TURNOVER KIT A (KITS) ×1 IMPLANT
KNIFE BAYONET SHORT DISCETOMY (MISCELLANEOUS) IMPLANT
KWIRE 1.6 NITINOL SHARP TIP (WIRE) IMPLANT
MANIFOLD NEPTUNE II (INSTRUMENTS) ×2 IMPLANT
MARKER SKIN DUAL TIP RULER LAB (MISCELLANEOUS) ×3 IMPLANT
MARKER SPHERE PSV REFLC 13MM (MARKER) ×7 IMPLANT
NDL SAFETY ECLIP 18X1.5 (MISCELLANEOUS) ×1 IMPLANT
NS IRRIG 1000ML POUR BTL (IV SOLUTION) ×1 IMPLANT
PACK LAMINECTOMY NEURO (CUSTOM PROCEDURE TRAY) ×1 IMPLANT
PAD ARMBOARD 7.5X6 YLW CONV (MISCELLANEOUS) ×1 IMPLANT
PENCIL ELECTRO HAND CTR (MISCELLANEOUS) IMPLANT
ROD SPINE CURVE 5.5X50 (Rod) IMPLANT
SCREW CREO 6.5X45 FEN (Screw) IMPLANT
SCREW CREO MIS 30 TULIP (Screw) IMPLANT
SOLUTION IRRIG SURGIPHOR (IV SOLUTION) ×1 IMPLANT
SPACER HEDRON 10D 18X50X13 (Spacer) IMPLANT
SPONGE GAUZE 2X2 8PLY STRL LF (GAUZE/BANDAGES/DRESSINGS) IMPLANT
STAPLER SKIN PROX 35W (STAPLE) IMPLANT
SURGIFLO W/THROMBIN 8M KIT (HEMOSTASIS) ×1 IMPLANT
SUT DVC VLOC 3-0 CL 6 P-12 (SUTURE) ×1 IMPLANT
SUT ETHILON 3-0 FS-10 30 BLK (SUTURE) ×2
SUT VIC AB 0 CT1 18XCR BRD 8 (SUTURE) IMPLANT
SUT VIC AB 0 CT1 27 (SUTURE) ×2
SUT VIC AB 0 CT1 27XCR 8 STRN (SUTURE) ×1 IMPLANT
SUT VIC AB 0 CT1 8-18 (SUTURE) ×1
SUT VIC AB 2-0 CT1 18 (SUTURE) ×1 IMPLANT
SUTURE EHLN 3-0 FS-10 30 BLK (SUTURE) IMPLANT
SYR 10ML LL (SYRINGE) ×2 IMPLANT
SYR 20ML LL LF (SYRINGE) IMPLANT
SYR 30ML LL (SYRINGE) ×2 IMPLANT
SYR 3ML LL SCALE MARK (SYRINGE) ×1 IMPLANT
TOWEL OR 17X26 4PK STRL BLUE (TOWEL DISPOSABLE) ×5 IMPLANT
TRAP FLUID SMOKE EVACUATOR (MISCELLANEOUS) ×1 IMPLANT
TRAY FOLEY MTR SLVR 16FR STAT (SET/KITS/TRAYS/PACK) IMPLANT
TROCAR INSERT W/PEDICLE NDL (TROCAR) IMPLANT
TROCAR INSERT W/PEDICLE NDLE (TROCAR) ×1
TUBING CONNECTING 10 (TUBING) ×2 IMPLANT
WATER STERILE IRR 1000ML POUR (IV SOLUTION) ×2 IMPLANT
WATER STERILE IRR 500ML POUR (IV SOLUTION) IMPLANT

## 2021-10-28 NOTE — Anesthesia Postprocedure Evaluation (Signed)
Anesthesia Post Note  Patient: Jared Boyd.  Procedure(s) Performed: L3-4 POSTERIOR SPINAL DECOMPRESSION (Spine Lumbar) L4-5 LATERAL LUMBAR INTERBODY FUSION WITH POSTERIOR SPINAL FIXATION (Spine Lumbar) APPLICATION OF INTRAOPERATIVE CT SCAN (Spine Lumbar)  Patient location during evaluation: PACU Anesthesia Type: General Level of consciousness: awake and alert, oriented and patient cooperative Pain management: pain level controlled Vital Signs Assessment: post-procedure vital signs reviewed and stable Respiratory status: spontaneous breathing, nonlabored ventilation and respiratory function stable Cardiovascular status: blood pressure returned to baseline and stable Postop Assessment: adequate PO intake Anesthetic complications: no   No notable events documented.   Last Vitals:  Vitals:   10/28/21 2030 10/28/21 2115  BP: 139/81 136/84  Pulse: 77 80  Resp: 13 16  Temp: (!) 36.3 C 37 C  SpO2: 95% 93%    Last Pain:  Vitals:   10/28/21 2124  TempSrc:   PainSc: [place]

## 2021-10-28 NOTE — Anesthesia Preprocedure Evaluation (Addendum)
Anesthesia Evaluation  Patient identified by MRN, date of birth, ID band Patient awake    Reviewed: Allergy & Precautions, NPO status , Patient's Chart, lab work & pertinent test results  Airway Mallampati: III  TM Distance: >3 FB Neck ROM: full    Dental  (+) Edentulous Upper, Edentulous Lower   Pulmonary neg pulmonary ROS, former smoker,    Pulmonary exam normal        Cardiovascular negative cardio ROS Normal cardiovascular exam     Neuro/Psych PSYCHIATRIC DISORDERS Anxiety Depression Chronic bilateral low back pain with bilateral sciatica  Neurogenic claudication due to lumbar spinal stenosis   Spondylolisthesis at L4-L5    GI/Hepatic Neg liver ROS, GERD  Controlled and Medicated,  Endo/Other  negative endocrine ROS  Renal/GU      Musculoskeletal  (+) Arthritis ,   Abdominal   Peds  Hematology negative hematology ROS (+)   Anesthesia Other Findings Past Medical History: No date: Anxiety 06/2017: Arthritis     Comment:  osteoarthritis of lower extremities No date: Depression No date: GERD (gastroesophageal reflux disease) No date: Hypercholesteremia 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 plates & screws removed, 2005 cages in. No date: CARPAL TUNNEL RELEASE No date: COLONOSCOPY, ESOPHAGOGASTRODUODENOSCOPY (EGD) AND ESOPHAGEAL  DILATION 2017: JOINT REPLACEMENT; Left     Comment:  partial knee replacement 07/23/2017: KNEE ARTHROTOMY; Right     Comment:  Procedure: KNEE ARTHROTOMY WITH POLY EXCHANGE;  Surgeon:              Corky Mull, MD;  Location: [facility] ORS;  Service:                Orthopedics;  Laterality: Right; 07/16/2017: PARTIAL KNEE ARTHROPLASTY; Right     Comment:  Procedure: UNICOMPARTMENTAL KNEE;  Surgeon: Corky Mull, MD;  Location: [facility] ORS;  Service: Orthopedics;                Laterality: Right; 1993: POSTERIOR LAMINECTOMY / DECOMPRESSION LUMBAR SPINE No date: TONSILLECTOMY     Comment:  as a child     Reproductive/Obstetrics negative OB ROS                            Anesthesia Physical Anesthesia Plan  ASA: 2  Anesthesia Plan: General ETT   Post-op Pain Management: Ofirmev IV (intra-op)* and Toradol IV (intra-op)*   Induction: Intravenous  PONV Risk Score and Plan: 2 and Ondansetron, Dexamethasone and Midazolam  Airway Management Planned: Oral ETT  Additional Equipment:   Intra-op Plan:   Post-operative Plan: Extubation in OR  Informed Consent: I have reviewed the patients History and Physical, chart, labs and discussed the procedure including the risks, benefits and alternatives for the proposed anesthesia with the patient or authorized representative who has indicated his/her understanding and acceptance.     Dental Advisory Given  Plan Discussed with: Anesthesiologist, CRNA and Surgeon  Anesthesia Plan Comments: (Stress dose steroids )  Anesthesia Quick Evaluation  

## 2021-10-28 NOTE — Op Note (Addendum)
Indications: Jared Boyd is a 66 yo male who presented with:  Chronic bilateral low back pain with bilateral sciatica M54.42, M54.41, G89.29, Neurogenic claudication due to lumbar spinal stenosis M48.062, Spondylolisthesis at L4-L5 level M43.16  He failed conservative management prompting surgical intervention.  Findings: L4-5 arthrodesis  Preoperative Diagnosis: Chronic bilateral low back pain with bilateral sciatica M54.42, M54.41, G89.29, Neurogenic claudication due to lumbar spinal stenosis M48.062, Spondylolisthesis at L4-L5 level M43.16 Postoperative Diagnosis: same   EBL: 100 ml IVF: see AR ml Drains: 1 placed Disposition: Extubated and Stable to PACU Complications: none  A foley catheter was placed.   Preoperative Note:   Risks of surgery discussed include: infection, bleeding, stroke, coma, death, paralysis, CSF leak, nerve/spinal cord injury, numbness, tingling, weakness, complex regional pain syndrome, recurrent stenosis and/or disc herniation, vascular injury, development of instability, neck/back pain, need for further surgery, persistent symptoms, development of deformity, and the risks of anesthesia. The patient understood these risks and agreed to proceed.  NAME OF ANTERIOR PROCEDURE:               1. Anterior lumbar interbody fusion via a right lateral retroperitoneal approach at L4/5 2. Placement of a Lordotic Globus Hedron interbody cage, filled with Demineralized Bone Matrix  NAME OF POSTERIOR PROCEDURE 1. Posterior instrumentation using Globus Creo Instrumentation 2. Posterolateral fusion, L4/5 utilizing demineralized bone matrix 3. Use of Stereotaxis 4. Posterior decompression, L3/4   PROCEDURE:  Patient was brought to the operating room, intubated, turned to the lateral position.  All pressure points were checked and double-checked.  The patient was prepped and draped in the standard fashion. Prior to prepping, fluoroscopy was brought in and the patient was  positioned with a large bump under the contralateral side between the iliac crest and rib cage, allowing the area between the iliac crest and the lateral aspect of the rib cage to open and increase the ability to reach inferiorly, to facilitate entry into the disc space.  The incision was marked upon the skin both the location of the disc space as well as the superior most aspect of the iliac crest.  Based on the identification of the disc space an incision was prepared, marked upon the skin and eventually was used for our lateral incision.  The fluoroscopy was turned into a cross table A/P image in order to confirm that the patient's spine remained in a perpendicular trajectory to the floor without rotation.  Once confirming that all the pressure points were checked and double-checked and the patient remained in sturdy position strapped down in this slightly jack-knifed lateral position, the patient was prepped and draped in standard fashion.  The skin was injected with local anesthetic, then incised until the abdominal wall fascia was noted.  I bluntly dissected posteriorly until we were able to identify the posterior musculature near petit's triangle.  At this point, using primarily blunt dissection with our finger aided with a metzenbaum scissor, were able to enter the retroperitoneal cavity.  The retroperitoneal potential space was opened further until palpating out the psoas muscle, the medial aspect of the iliac crest, the medial aspect of the last rib and continued to define the retroperitoneal space with blunt dissection in order to facilitate safe placement of our dilators.    While protecting by dissecting directly onto a finger in the retroperitoneum, the retroperitoneal space was entered safely from the lateral incision and the initial dilator placed onto the muscle belly of the psoas.  While directly stimulating the dilator and  after radiographically confirming our location relative to the disc  space, I placed the dilator through the psoas.  The dilators were stimulated to ensure remaining safely away from any of the lumbar plexus nerves; the dilators were repositioned until no pathologic stimulation was appreciated.  Once I had confirmed the location of our initial dilator radiographically, a K-wire secured the dilator into the L7/8 disc space and confirmed position under A/P and lateral fluoroscopy.  At this point, I dilated up with direct stimulation to confirm lack of pathologic stimulation.  Once all the dilators were in position, I placed in the retractor and secured it onto the table, locked into position and confirmed under A/P and lateral fluoroscopy to confirm our approach angle to the disc space as well as location relative to the disc space.  I then placed the muscle stimulator in through the working channel down to the vertebral body, stimulating the entire lateral surface of the vertebral body and any of the visualized psoas muscle that was adjacent to the retractor, confirming again the safe passage to the psoas before we began performing the discectomy.  At this point, we began our discectomy at L4/5.  The disc was incised laterally throughout the extent of our exposure. Using a combination of pituitary rongeurs, Kerrison rongeurs, rasps, curettes of various sorts, we were able to begin to clean out the disc space.  Once we had cleaned out the majority of the disc space, we then cut the lateral annulus with a cob, breaking the lateral annual attachments on the contralateral side by subtly working the cob through the annulus while using flouroscopy.  Care was taken not to extend further than required after cutting the annular attachments.  After this had been performed, we prepared the endplates for placement of our graft, sized a graft to the disc space by serially dilating up in trial sizes until we confirmed that our graft would be well positioned, allowing distraction while  maintaining good grip.  This was confirmed under A/P and lateral fluoroscopy in order to ensure its placement as an eventual trial for placement of our final graft.  We irrigated with bacteriostatic saline.  Once confirmed placement, the Hedron implant filled with allograft was impacted into position at L4/5.   Through a combination of intradiscal distraction and anterior releasing, we were able to correct the anterior deformity during disc preparation and placement of the graft.  At this point, final radiographs were performed, and we began closure.  The wound was closed using 0 Vicryl interrupted suture in the fascia and 2-0 Vicryl inverted suture were placed in the subcutaneous tissue and dermis. 3-0 monocryl was used for final closure. Dermabond was used to close the skin.    After closing the anterior part in layers, the patient was repositioned into prone position.  All pressure points were checked and double-checked.  The posterior operative site was prepped and draped in standard fashion.  The stereotactic array was placed.  Stereotactic images were acquired using intraoperative CT scanning.  This was registered to the patient.  Using stereotaxis, screw trajectories were planned and incisions made.  The pedicles from L4 to L5 were cannulated bilaterally and K wires used to secure the tracks.  We then utilized a stereotactic screwdriver to place pedicle screws from L4 to L5.  At each level, 6.5x28mm Globus Creo pedicle screws were placed.  Once the screws were placed, the screw extensions were then linked, a path was formed for the rod and a rod  was utilized to connect the screws.  We then compressed, torqued / counter-torqued and removed the screw assembly. Once performed on each side, the C-arm was brought back and to take confirmatory CT scan showing appropriate placement of all instrumentation and anatomic alignment.    Posterolateral arthrodesis was performed at L4-L5 utilizing  demineralized bone matrix.  We then utilized the stereotaxis to place the tubular retractors at L3/4 via a separate incision.  After performing the fusion at L4-5, the metrx tubes were sequentially advanced and confirmed in position at L3-4. An 40mm by 40mm tube was locked in place to the bed side attachment.  Fluoroscopy was then removed from the field.  The microscope was then sterilely brought into the field and muscle creep was hemostased with a bipolar and resected with a pituitary rongeur.  A Bovie extender was then used to expose the spinous process and lamina.  Careful attention was placed to not violate the facet capsule. A 3 mm matchstick drill bit was then used to make a hemi-laminotomy trough until the ligamentum flavum was exposed.  This was extended to the base of the spinous process and to the contralateral side to remove all the central bone from each side.  Once this was complete and the underlying ligamentum flavum was visualized, it was dissected with a curette and resected with Kerrison rongeurs.  Extensive ligamentum hypertrophy was noted, requiring a substantial amount of time and care for removal.  The dura was identified and palpated. The kerrison rongeur was then used to remove the medial facet bilaterally until no compression was noted.  A balltip probe was used to confirm decompression of the ipsilateral L4 nerve root.  Additional attention was paid to completion of the contralateral foraminotomy until the contralateral L4 nerve root was completely free.  Once this was complete, L3-4 central decompression including medial facetectomy and foraminotomy was confirmed and decompression on both sides was confirmed. No CSF leak was noted.  The wound was copiously irrigated. The tube system was then removed under microscopic visualization and hemostasis was obtained with a bipolar.  A drain was placed.   Each wound was closed using 0 Vicryl interrupted suture in the fascia, 2-0 Vicryl  inverted suture were placed in the subcutaneous tissue and dermis. 3-0 monocryl was used for final closure. Dermabond was used to close the skin.    Needle, lap and all counts were correct at the end of the case.     Jared Leach PA assisted in the entire procedure. An assistant was required for this procedure due to the complexity.  The assistant provided assistance in tissue manipulation and suction, and was required for the successful and safe performance of the procedure. I performed the critical portions of the procedure.   Venetia Night MD Neurosurgery

## 2021-10-28 NOTE — H&P (Signed)
Referring Physician:  No referring provider defined for this encounter.  Primary Physician:  Maryland Pink, MD  History of Present Illness: 10/28/2021 [name] is here today with a chief complaint of back and leg issues.  His symptoms are noted below.  09/26/2021 [name] is here today with a chief complaint of left sided low back pain that radiates into the left hip along with constant numbness in the bilateral legs from the knee down to the feet.    He has been having pain for 2 years.  He reports dull burning and hypersensitivity below his knees bilaterally.  He also has severe pain into his left hip.  He also reports back pain.  His symptoms get worse by standing and walking.  Lifting and twisting makes his symptoms worse.  He has tried physical therapy and injections.     Bowel/Bladder Dysfunction: none   Conservative measures:  Physical therapy: has participated in at [facility] from 08/14/21 to 09/18/21 Multimodal medical therapy including regular antiinflammatories: tylenol, cymbalta, meloxicam, gabapentin, tizanidine Injections: has had epidural steroid injections 05/13/21: Bilateral S1 TF ESI 04/19/21: Bilateral L4-5 TF ESI (no relief) 08/02/20: Bilateral L4-5 TF ESI (70% improvement)   Past Surgery:  Lumbar surgery in 1998 Dr. Mauri Pole  2 neck surgeries   [name]. has no symptoms of cervical myelopathy.   The symptoms are causing a significant impact on the patient's life.    Review of Systems:  A 10 point review of systems is negative, except for the pertinent positives and negatives detailed in the HPI.  Past Medical History: Past Medical History:  Diagnosis Date   Anxiety    Arthritis 06/2017   osteoarthritis of lower extremities   Depression    GERD (gastroesophageal reflux disease)    Hypercholesteremia    Keratoacanthoma 02/07/2008   LEFT FOREARM: COMPATIBLE WITH KERATOACANTHOMA, BASE INVOLVED   Neuromuscular disorder (HCC)     neuralgia, neuritis and radiculitis   Pneumonia    Substance abuse ([place])    recovered/clean  x 11 years    Past Surgical History: Past Surgical History:  Procedure Laterality Date   BACK SURGERY  1998   cervical fx. plates & screws removed, 2005 cages in.   CARPAL TUNNEL RELEASE     COLONOSCOPY, ESOPHAGOGASTRODUODENOSCOPY (EGD) AND ESOPHAGEAL DILATION     JOINT REPLACEMENT Left 2017   partial knee replacement   KNEE ARTHROTOMY Right 07/23/2017   Procedure: KNEE ARTHROTOMY WITH POLY EXCHANGE;  Surgeon: Corky Mull, MD;  Location: [facility] ORS;  Service: Orthopedics;  Laterality: Right;   PARTIAL KNEE ARTHROPLASTY Right 07/16/2017   Procedure: UNICOMPARTMENTAL KNEE;  Surgeon: Corky Mull, MD;  Location: [facility] ORS;  Service: Orthopedics;  Laterality: Right;   POSTERIOR LAMINECTOMY / DECOMPRESSION LUMBAR SPINE  1993   TONSILLECTOMY     as a child    Allergies: Allergies as of 10/01/2021   (No Known Allergies)    Medications: Current Meds  Medication Sig   acetaminophen (TYLENOL) 500 MG tablet Take 1,000 mg by mouth 3 (three) times daily.   buPROPion (WELLBUTRIN SR) 150 MG 12 hr tablet Take 150 mg by mouth 2 (two) times daily. 0500 & 1200   cetirizine (ZYRTEC) 10 MG tablet Take 10 mg by mouth daily.   chlorpheniramine (CHLOR-TRIMETON) 4 MG tablet Take 4 mg by mouth 2 (two) times daily. NOON & NIGHT.   docusate sodium (COLACE) 100 MG capsule Take 100 mg by mouth 3 (three) times  daily.   DULoxetine (CYMBALTA) 20 MG capsule Take 20 mg by mouth daily.   folic acid (FOLVITE) 1 MG tablet Take 1 mg by mouth daily.   LORazepam (ATIVAN) 0.5 MG tablet Take 0.5 tablets by mouth 3 (three) times daily.   omeprazole (PRILOSEC) 20 MG capsule Take 20 mg by mouth daily.   simvastatin (ZOCOR) 40 MG tablet Take 40 mg by mouth at bedtime.   tadalafil (CIALIS) 5 MG tablet Take 5 mg by mouth daily.   triamcinolone (NASACORT) 55 MCG/ACT AERO nasal inhaler Place 2 sprays into the nose daily.     Social History: Social History   Tobacco Use   Smoking status: Former    Packs/day: 2.50    Years: 38.00    Total pack years: 95.00    Types: Cigarettes    Quit date: 2008    Years since quitting: 15.7   Smokeless tobacco: Never  Vaping Use   Vaping Use: Never used  Substance Use Topics   Alcohol use: Not Currently    Comment: 8 years clean   Drug use: Never    Comment: 8 years clean.     Family Medical History: History reviewed. No pertinent family history.  Physical Examination: Vitals:   10/28/21 1213  BP: (!) 148/98  Pulse: 73  Resp: 16  Temp: 97.8 F (36.6 C)  SpO2: 97%   Heart sounds normal no MRG. Chest Clear to Auscultation Bilaterally.  General: Patient is well developed, well nourished, calm, collected, and in no apparent distress. Attention to examination is appropriate.  Neck:   Supple.  Full range of motion.  Respiratory: Patient is breathing without any difficulty.   NEUROLOGICAL:     Awake, alert, oriented to person, place, and time.  Speech is clear and fluent. Fund of knowledge is appropriate.   Cranial Nerves: Pupils equal round and reactive to light.  Facial tone is symmetric.  Facial sensation is symmetric. Shoulder shrug is symmetric. Tongue protrusion is midline.  There is no pronator drift.  ROM of spine: full.    Strength: Side Biceps Triceps Deltoid Interossei Grip Wrist Ext. Wrist Flex.  R _0 L _1 Side Iliopsoas Quads Hamstring PF DF EHL  R _2 L _3 Reflexes are 1+ and symmetric at the biceps, triceps, brachioradialis, patella and achilles.   Hoffman's is absent.   Bilateral upper and lower extremity sensation is intact to light touch.    No evidence of dysmetria noted.  Gait is antalgic.     Medical Decision Making  Imaging: MRI L spine 08/21/2021 IMPRESSION: Moderate spinal stenosis and moderate subarticular stenosis bilaterally L3-4, similar to the prior MRI 2022    Mild spinal stenosis L4-5 has improved. Moderate subarticular stenosis L4-5 unchanged. Improvement in mild anterolisthesis L4-5.     Electronically Signed   By: Franchot Gallo M.D.   On: 08/21/2021 11:13   Flexion and extension  x-rays from September 26, 2021 show 6.3 mm of anterolisthesis of L4 and L5 on flexion with minimal anterolisthesis on extension.  I have personally reviewed the images and agree with the above interpretation.  Assessment and Plan: Mr. Musick is a pleasant 66 y.o. male with bilateral sciatica with neurogenic claudication as well.  He has severe facet arthrosis at L4-5.  He has abnormal translation of L4 and L5 with flexion extension.  Thus, he has anterolisthesis of L4 and L5.  There are significant facet joint effusions.  He previously had an anterolisthesis at this level.  He has met consideration for surgical intervention.  No further conservative management is indicated.   I recommended L3-4 decompression with L4-5 lateral lumbar interbody fusion with percutaneous fixation and fusion.   Thank you for involving me in the care of this patient.      Shelbi Vaccaro K. Izora Ribas MD, [facility] Neurosurgery

## 2021-10-28 NOTE — Transfer of Care (Signed)
Immediate Anesthesia Transfer of Care Note  Patient: Jared Boyd.  Procedure(s) Performed: L3-4 POSTERIOR SPINAL DECOMPRESSION (Spine Lumbar) L4-5 LATERAL LUMBAR INTERBODY FUSION WITH POSTERIOR SPINAL FIXATION (Spine Lumbar) APPLICATION OF INTRAOPERATIVE CT SCAN (Spine Lumbar)  Patient Location: PACU  Anesthesia Type:General  Level of Consciousness: sedated  Airway & Oxygen Therapy: Patient Spontanous Breathing and Patient connected to face mask oxygen  Post-op Assessment: Report given to RN and Post -op Vital signs reviewed and stable  Post vital signs: Reviewed and stable  Last Vitals:  Vitals Value Taken Time  BP 139/84 10/28/21 1930  Temp    Pulse 73 10/28/21 1935  Resp 13 10/28/21 1935  SpO2 97 % 10/28/21 1935  Vitals shown include unvalidated device data.  Last Pain:  Vitals:   10/28/21 1213  TempSrc: Temporal  PainSc: 5          Complications: No notable events documented.

## 2021-10-28 NOTE — Anesthesia Procedure Notes (Signed)
Procedure Name: Intubation Date/Time: 10/28/2021 3:00 PM  Performed by: Cammie Sickle, CRNAPre-anesthesia Checklist: Patient identified, Patient being monitored, Timeout performed, Emergency Drugs available and Suction available Patient Re-evaluated:Patient Re-evaluated prior to induction Oxygen Delivery Method: Circle system utilized Preoxygenation: Pre-oxygenation with 100% oxygen Induction Type: IV induction Ventilation: Mask ventilation without difficulty Laryngoscope Size: McGraph and 4 Grade View: Grade I Tube type: Oral Tube size: 7.5 mm Number of attempts: 1 Airway Equipment and Method: Stylet Placement Confirmation: ETT inserted through vocal cords under direct vision, positive ETCO2, breath sounds checked- equal and bilateral and CO2 detector Secured at: 21 cm Tube secured with: Tape Dental Injury: Teeth and Oropharynx as per pre-operative assessment  Comments: Inserted by Laurence Spates

## 2021-10-29 MED ORDER — CELECOXIB 200 MG PO CAPS
200.0000 mg | ORAL_CAPSULE | Freq: Two times a day (BID) | ORAL | Status: DC
  Filled 2021-10-29 (×2): qty 1

## 2021-10-29 NOTE — Progress Notes (Signed)
Spoke to the patient and discussed dc plan ane needs He lives at home with his wife She provides transportation He needs a rolling walker and a 3 in 1, [employer] to deliver to the room prior to DC He was accepted by [place] for [facility] services No Additional need

## 2021-10-29 NOTE — Progress Notes (Signed)
    Attending Progress Note  History: Jared Boyd. is here for spondylolisthesis, neurogenic claudication, back pain with sciatica.  POD1: doing well.  Moderate back pain as expected.  Physical Exam: Vitals:   10/29/21 0143 10/29/21 0443  BP: 130/74 120/60  Pulse: 72 96  Resp: 16 18  Temp: 98.3 F (36.8 C) 97.7 F (36.5 C)  SpO2: 94% 96%    AA Ox3 CNI  Strength:5/5 throughout BLE Drain not yet recorded  Data:  Recent Labs  Lab 10/28/21 1208  NA 138  K 3.8  CL 109  CO2 21*  BUN 17  CREATININE 0.79  GLUCOSE 96  CALCIUM 9.4   No results for input(s): "AST", "ALT", "ALKPHOS" in the last 168 hours.  Invalid input(s): "TBILI"   Recent Labs  Lab 10/28/21 1208  WBC 8.1  HGB 16.4  HCT 49.0  PLT 201   No results for input(s): "APTT", "INR" in the last 168 hours.       Other tests/results: n/a  Assessment/Plan:  Jared Boyd. Is doing well post-op  - mobilize - pain control - DVT prophylaxis - PTOT today - watch drain   Meade Maw MD, [facility] Department of Neurosurgery

## 2021-10-29 NOTE — Plan of Care (Signed)

## 2021-10-29 NOTE — Evaluation (Signed)
Physical Therapy Evaluation Patient Details Name: Jared Boyd. MRN: [id number] DOB: Nov 10, 1955 Today's Date: 10/29/2021  History of Present Illness  Jared Boyd. is a Jared Boyd comes to [facility] 10/28/21 for scheduled neurosurgery with Dr. Myer Haff for spondylolisthesis, neurogenic claudication, back pain with sciatica.  Pt underwent L3-4 POSTERIOR SPINAL DECOMPRESSION, L4-5 LATERAL LUMBAR INTERBODY FUSION WITH POSTERIOR SPINAL FIXATION.  Clinical Impression  Pt admitted with above Dx. Pt has functional limitations due to deficits below (see "PT Problem List"). Pt able to provide details on baseline functional status. Pt already back in bed upon arrival, did not tolerate appreciable amount of time in recliner after OT eval. MinA and education for log roll, supervision STS from elevated surface, and labored, slow efforts for 22ft AMB c RW. Unable to begin stairs training. Pt very pain limited in general. Today pt requires considerable physical assistance to perform bed mobility, transfers, and short distance walking. Patient's performance this date reveals decreased ability, independence, and tolerance in performing all basic mobility required for performance of activities of daily living. Pt requires additional DME, close physical assistance, and cues for safe participate in mobility. Pt will benefit from skilled PT intervention to increase independence and safety with basic mobility in preparation for discharge to the venue listed below.        Recommendations for follow up therapy are one component of a multi-disciplinary discharge planning process, led by the attending physician.  Recommendations may be updated based on patient status, additional functional criteria and insurance authorization.  Follow Up Recommendations Home health PT      Assistance Recommended at Discharge Intermittent Supervision/Assistance  Patient can return home with the following  A little help with  bathing/dressing/bathroom;Assist for transportation;Assistance with cooking/housework;Help with stairs or ramp for entrance    Equipment Recommendations Rolling walker (2 wheels);BSC/3in1  Recommendations for Other Services       Functional Status Assessment Patient has had a recent decline in their functional status and demonstrates the ability to make significant improvements in function in a reasonable and predictable amount of time.     Precautions / Restrictions Precautions Precautions: Back Precaution Comments: BAT precautions Restrictions Weight Bearing Restrictions: No      Mobility  Bed Mobility Overal bed mobility: Needs Assistance Bed Mobility: Supine to Sit, Sit to Supine     Supine to sit: Min assist Sit to supine: Min assist   General bed mobility comments: reviewed concept of log roll, 2 pillows between knees, assist with legs, simulated home side of bed    Transfers Overall transfer level: Needs assistance Equipment used: Rolling walker (2 wheels) Transfers: Sit to/from Stand Sit to Stand: Supervision, From elevated surface           General transfer comment: no cues needed    Ambulation/Gait Ambulation/Gait assistance: Min guard Gait Distance (Feet): 30 Feet Assistive device: Rolling walker (2 wheels) Gait Pattern/deviations: Step-to pattern       General Gait Details: labored, antalgic, slow, takes seevral standing breaks  Stairs            Wheelchair Mobility    Modified Rankin (Stroke Patients Only)       Balance                                             Pertinent Vitals/Pain Pain Assessment Pain Assessment: 0-10 Pain Score: 6  Pain  Location: low back and bilat posterior hip pain Pain Descriptors / Indicators: Aching Pain Intervention(s): Limited activity within patient's tolerance, Monitored during session, Premedicated before session, Repositioned    Home Living Family/patient expects to be  discharged to:: Private residence Living Arrangements: Spouse/significant other Available Help at Discharge: Family;Available 24 hours/day (wife took off all week to help at home) Type of Home: House Home Access: Stairs to enter Entrance Stairs-Rails: Left Entrance Stairs-Number of Steps: 2   Home Layout: Two level;Able to live on main level with bedroom/bathroom Home Equipment: None Additional Comments: Pt able to tolerate household distances without device, but longer distances caused neurogenic claudication;    Prior Function Prior Level of Function : Independent/Modified Independent             Mobility Comments: could perform bed mobility modI, flat bed; plans to sleep in recliner at DC.       Hand Dominance        Extremity/Trunk Assessment                Communication      Cognition Arousal/Alertness: Awake/alert Behavior During Therapy: WFL for tasks assessed/performed Overall Cognitive Status: Within Functional Limits for tasks assessed                                          General Comments      Exercises     Assessment/Plan    PT Assessment Patient needs continued PT services  PT Problem List Decreased strength;Decreased range of motion;Decreased activity tolerance;Decreased balance;Decreased mobility;Decreased cognition;Decreased knowledge of use of DME;Decreased safety awareness;Decreased knowledge of precautions;Pain       PT Treatment Interventions DME instruction;Balance training;Gait training;Stair training;Functional mobility training;Therapeutic activities;Therapeutic exercise;Neuromuscular re-education;Patient/family education    PT Goals (Current goals can be found in the Care Plan section)  Acute Rehab PT Goals Patient Stated Goal: be able to AMB indpeendnently in home at DC PT Goal Formulation: With patient Time For Goal Achievement: 11/12/21 Potential to Achieve Goals: Good    Frequency 7X/week      Co-evaluation               AM-PAC PT "6 Clicks" Mobility  Outcome Measure Help needed turning from your back to your side while in a flat bed without using bedrails?: A Lot Help needed moving from lying on your back to sitting on the side of a flat bed without using bedrails?: A Lot Help needed moving to and from a bed to a chair (including a wheelchair)?: A Lot Help needed standing up from a chair using your arms (e.g., wheelchair or bedside chair)?: A Lot Help needed to walk in hospital room?: A Little Help needed climbing 3-5 steps with a railing? : A Little 6 Click Score: 14    End of Session Equipment Utilized During Treatment: Gait belt Activity Tolerance: Patient tolerated treatment well;Patient limited by fatigue;Patient limited by pain Patient left: in bed;with call bell/phone within reach;with SCD's reapplied Nurse Communication: Mobility status PT Visit Diagnosis: Difficulty in walking, not elsewhere classified (R26.2);Other abnormalities of gait and mobility (R26.89)    Time: 1191-4782 PT Time Calculation (min) (ACUTE ONLY): 44 min   Charges:   PT Evaluation $PT Eval Moderate Complexity: 1 Mod PT Treatments $Gait Training: 8-22 mins $Therapeutic Activity: 8-22 mins       12:10 PM, 10/29/21 Rosamaria Lints, PT, DPT Physical Therapist -  [facility]  [phone] ([place])     Altovise Wahler C 10/29/2021, 12:07 PM

## 2021-10-29 NOTE — Progress Notes (Cosign Needed)
Patient is not able to walk the distance required to go the bathroom, or he/she is unable to safely negotiate stairs required to access the bathroom.  A 3in1 BSC will alleviate this problem  

## 2021-10-29 NOTE — Evaluation (Signed)
Occupational Therapy Evaluation Patient Details Name: Jared Boyd. MRN: [id number] DOB: [birth year] Today's Date: 10/29/2021   History of Present Illness Ricki Clack. is a [name] comes to [facility] 10/28/21 for scheduled neurosurgery with Dr. Myer Haff for spondylolisthesis, neurogenic claudication, back pain with sciatica.  Pt underwent L3-4 POSTERIOR SPINAL DECOMPRESSION, L4-5 LATERAL LUMBAR INTERBODY FUSION WITH POSTERIOR SPINAL FIXATION.   Clinical Impression   Pt seen for OT evaluation this date, POD#1 from above back surgery. Prior to hospital admission, pt was mod independent with mobility holding onto furniture, side stepping, and leaning/sitting often due to pain with mobility. Spouse assisted with tub transfers but otherwise pt indep with ADL. 1 fall in past 6 months. Pt lives with spouse in a single family home with 2 steps to enter and L rail with spouse able to provide assist/support as needed for pt. Currently pt requires supv for bed mobility, CGA for ADL transfers with initial instruction in RW mgt and hand placement. Pt educated in back precautions with handout provided, self care skills, bed mobility and functional transfer training, AE/DME for bathing, dressing, and toileting needs, and home/routines modifications and falls prevention strategies to maximize safety and functional independence while minimizing falls risk and maintaining precautions. Pt verbalized understanding of all education/training provided. Handout provided to support recall and carry over of learned precautions/techniques for bed mobility, functional transfers, and self care skills. Pt will benefit from additional skilled OT services to maximize return to PLOF.      Recommendations for follow up therapy are one component of a multi-disciplinary discharge planning process, led by the attending physician.  Recommendations may be updated based on patient status, additional functional criteria and insurance  authorization.   Follow Up Recommendations  No OT follow up    Assistance Recommended at Discharge Intermittent Supervision/Assistance  Patient can return home with the following A little help with walking and/or transfers;A little help with bathing/dressing/bathroom;Assistance with cooking/housework;Assist for transportation;Help with stairs or ramp for entrance    Functional Status Assessment  Patient has had a recent decline in their functional status and demonstrates the ability to make significant improvements in function in a reasonable and predictable amount of time.  Equipment Recommendations  BSC/3in1;Other (comment) (2WW)    Recommendations for Other Services       Precautions / Restrictions Precautions Precautions: Back Precaution Comments: BAT precautions Restrictions Weight Bearing Restrictions: No      Mobility Bed Mobility Overal bed mobility: Needs Assistance Bed Mobility: Supine to Sit     Supine to sit: Min guard, HOB elevated     General bed mobility comments: educated in log roll    Transfers Overall transfer level: Needs assistance Equipment used: Rolling walker (2 wheels) Transfers: Sit to/from Stand Sit to Stand: Supervision, From elevated surface           General transfer comment: initial VC for hand placement and RW mgt      Balance Overall balance assessment: Needs assistance Sitting-balance support: No upper extremity supported, Feet supported Sitting balance-Leahy Scale: Fair     Standing balance support: Bilateral upper extremity supported Standing balance-Leahy Scale: Fair                             ADL either performed or assessed with clinical judgement   ADL  General ADL Comments: Pt currently requires MIN-MOD A for LB ADL tasks     Vision         Perception     Praxis      Pertinent Vitals/Pain Pain Assessment Pain Assessment: 0-10 Pain  Score: 7  Pain Location: 5/10 at rest, up to 7/10 with mobility Pain Descriptors / Indicators: Aching Pain Intervention(s): Limited activity within patient's tolerance, Monitored during session, Premedicated before session, Repositioned     Hand Dominance     Extremity/Trunk Assessment Upper Extremity Assessment Upper Extremity Assessment: Overall WFL for tasks assessed   Lower Extremity Assessment Lower Extremity Assessment: Defer to PT evaluation   Cervical / Trunk Assessment Cervical / Trunk Assessment: Back Surgery   Communication     Cognition Arousal/Alertness: Awake/alert Behavior During Therapy: WFL for tasks assessed/performed Overall Cognitive Status: Within Functional Limits for tasks assessed                                       General Comments       Exercises Other Exercises Other Exercises: Pt educated in back precautions and how to maintain during ADL/mobility, AE/DME, and falls prevention strategies; handout provided   Shoulder Instructions      Home Living Family/patient expects to be discharged to:: Private residence Living Arrangements: Spouse/significant other Available Help at Discharge: Family;Available 24 hours/day (wife took off all week to help at home) Type of Home: House Home Access: Stairs to enter [employer] of Steps: 2 Entrance Stairs-Rails: Left Home Layout: Two level;Able to live on main level with bedroom/bathroom     Bathroom Shower/Tub: Chief Strategy Officer: Standard     Home Equipment: None   Additional Comments: Pt able to tolerate household distances without device, but longer distances caused neurogenic claudication;      Prior Functioning/Environment Prior Level of Function : Independent/Modified Independent             Mobility Comments: could perform bed mobility modI, flat bed; plans to sleep in recliner at DC. Pt endorses furniture walking at baseline. ADLs Comments:  Assist for tub transfers, otherwise mod indep        OT Problem List: Decreased strength;Pain;Decreased range of motion;Decreased knowledge of use of DME or AE;Impaired balance (sitting and/or standing);Decreased activity tolerance;Decreased knowledge of precautions      OT Treatment/Interventions: Self-care/ADL training;Therapeutic exercise;Therapeutic activities;DME and/or AE instruction;Patient/family education;Balance training    OT Goals(Current goals can be found in the care plan section) Acute Rehab OT Goals Patient Stated Goal: have less pain, get better OT Goal Formulation: With patient Time For Goal Achievement: 11/12/21 Potential to Achieve Goals: Good ADL Goals Pt Will Perform Lower Body Dressing: with caregiver independent in assisting Pt Will Transfer to Toilet: with modified independence;ambulating (LRAD) Pt Will Perform Toileting - Clothing Manipulation and hygiene: with modified independence Additional ADL Goal #1: Pt will verbalize 100% of precautions and how to maintain during ADL/mobility.  OT Frequency: Min 2X/week    Co-evaluation              AM-PAC OT "6 Clicks" Daily Activity     Outcome Measure Help from another person eating meals?: None Help from another person taking care of personal grooming?: None Help from another person toileting, which includes using toliet, bedpan, or urinal?: A Little Help from another person bathing (including washing, rinsing, drying)?: A Little Help from another person  to put on and taking off regular upper body clothing?: None Help from another person to put on and taking off regular lower body clothing?: A Little 6 Click Score: 21   End of Session Equipment Utilized During Treatment: Gait belt;Rolling walker (2 wheels) Nurse Communication: Mobility status  Activity Tolerance: Patient limited by pain Patient left: in chair;with call bell/phone within reach  OT Visit Diagnosis: Other abnormalities of gait and  mobility (R26.89);Muscle weakness (generalized) (M62.81);Pain Pain - Right/Left:  (back)                Time: 8416-6063 OT Time Calculation (min): 32 min Charges:  OT General Charges $OT Visit: 1 Visit OT Evaluation $OT Eval Moderate Complexity: 1 Mod OT Treatments $Self Care/Home Management : 8-22 mins  Ardeth Perfect., MPH, MS, OTR/L ascom [phone] 10/29/21, 1:07 PM

## 2021-10-30 ENCOUNTER — Encounter: Payer: Self-pay | Admitting: Neurosurgery

## 2021-10-30 MED ORDER — SENNA 8.6 MG PO TABS
1.0000 | ORAL_TABLET | Freq: Every day | ORAL | Status: DC
  Administered 2021-10-30: 8.6 mg via ORAL
  Filled 2021-10-30: qty 1

## 2021-10-30 MED ORDER — POLYETHYLENE GLYCOL 3350 17 G PO PACK
17.0000 g | PACK | Freq: Every day | ORAL | Status: DC | PRN
  Administered 2021-10-30: 17 g via ORAL
  Filled 2021-10-30: qty 1

## 2021-10-30 MED ORDER — FLEET ENEMA 7-19 GM/118ML RE ENEM
1.0000 | ENEMA | Freq: Every day | RECTAL | Status: DC | PRN
  Administered 2021-10-31: 1 via RECTAL

## 2021-10-30 NOTE — Progress Notes (Signed)
Physical Therapy Treatment Patient Details Name: Jared Boyd. MRN: [id number] DOB: 05-03-1955 Today's Date: 10/30/2021   History of Present Illness Jared Boyd. is a Jordan Hawks comes to [facility] 10/28/21 for scheduled neurosurgery with Dr. Izora Ribas for spondylolisthesis, neurogenic claudication, back pain with sciatica.  Pt underwent L3-4 POSTERIOR SPINAL DECOMPRESSION, L4-5 LATERAL LUMBAR INTERBODY FUSION WITH POSTERIOR SPINAL FIXATION.    PT Comments    Pt in bed on entry, wife present. Pain meds received prior to entry. Pain was a factor last night, pt reports more pain today than previous. ModA still required for tortured bed mobility, unclear if wife can provide this much physical assistance at home. Pt able to transfer to standing with great pain and antalgia, but poor tolerance to >1-2 reps in session, requires elevated surface and 2 hand assist. AMB advanced in distance and speed, both by ~50%, but still taxing. Pt remains hopeful and motivated to do what he is able. Pain control is the biggest factor at present. He tolerated 5 minutes in chair yesterday and 15 minutes today.    Recommendations for follow up therapy are one component of a multi-disciplinary discharge planning process, led by the attending physician.  Recommendations may be updated based on patient status, additional functional criteria and insurance authorization.  Follow Up Recommendations  Skilled nursing-short term rehab (<3 hours/day) Can patient physically be transported by private vehicle: No   Assistance Recommended at Discharge Intermittent Supervision/Assistance  Patient can return home with the following Assist for transportation;Assistance with cooking/housework;Help with stairs or ramp for entrance;A lot of help with bathing/dressing/bathroom;A lot of help with walking and/or transfers   Equipment Recommendations  Rolling walker (2 wheels);BSC/3in1    Recommendations for Other Services        Precautions / Restrictions Precautions Precautions: Back Precaution Comments: BAT precautions Restrictions Weight Bearing Restrictions: No     Mobility  Bed Mobility Overal bed mobility: Needs Assistance Bed Mobility: Supine to Sit, Sit to Supine     Supine to sit: Mod assist Sit to supine: Mod assist   General bed mobility comments: still struggles with log roll, requires MODA of legs and still time limited and guarded    Transfers Overall transfer level: Needs assistance Equipment used: Rolling walker (2 wheels) Transfers: Sit to/from Stand Sit to Stand: Min assist           General transfer comment: too pain limited to perform multiple from elevated surface    Ambulation/Gait Ambulation/Gait assistance: Min guard Gait Distance (Feet): 60 Feet Assistive device: Rolling walker (2 wheels) Gait Pattern/deviations: Step-to pattern       General Gait Details: antalgic, takes 2 standing breaks, brief dizziness episode, speed somewhat improved but still slow   Stairs             Wheelchair Mobility    Modified Rankin (Stroke Patients Only)       Balance                                            Cognition Arousal/Alertness: Awake/alert Behavior During Therapy: WFL for tasks assessed/performed Overall Cognitive Status: Within Functional Limits for tasks assessed  Exercises General Exercises - Lower Extremity Heel Slides: AAROM, Both, 15 reps, Supine, Limitations Heel Slides Limitations: RT side significantly more painful despite modA    General Comments        Pertinent Vitals/Pain Pain Assessment Pain Assessment: 0-10 Pain Score: 7  Pain Location: With mobility Pain Descriptors / Indicators: Aching, Sore, Grimacing, Guarding Pain Intervention(s): Limited activity within patient's tolerance, Monitored during session, Premedicated before session, Repositioned     Home Living                          Prior Function            PT Goals (current goals can now be found in the care plan section) Acute Rehab PT Goals Patient Stated Goal: be able to AMB independently in home at DC PT Goal Formulation: With patient Time For Goal Achievement: 11/12/21 Potential to Achieve Goals: Good Progress towards PT goals: Progressing toward goals    Frequency    7X/week      PT Plan Discharge plan needs to be updated    Co-evaluation              AM-PAC PT "6 Clicks" Mobility   Outcome Measure  Help needed turning from your back to your side while in a flat bed without using bedrails?: A Lot Help needed moving from lying on your back to sitting on the side of a flat bed without using bedrails?: A Lot Help needed moving to and from a bed to a chair (including a wheelchair)?: A Lot Help needed standing up from a chair using your arms (e.g., wheelchair or bedside chair)?: A Lot Help needed to walk in hospital room?: A Little Help needed climbing 3-5 steps with a railing? : A Lot 6 Click Score: 13    End of Session   Activity Tolerance: Patient limited by pain Patient left: in bed;with call bell/phone within reach;with SCD's reapplied;with family/visitor present   PT Visit Diagnosis: Difficulty in walking, not elsewhere classified (R26.2);Other abnormalities of gait and mobility (R26.89)     Time: [id number] PT Time Calculation (min) (ACUTE ONLY): 31 min  Charges:  $Gait Training: 8-22 mins $Therapeutic Exercise: 8-22 mins                    4:57 PM, 10/30/21 Etta Grandchild, PT, DPT Physical Therapist - [facility]  [phone] ([place])    Kaliq Lege C 10/30/2021, 4:54 PM

## 2021-10-30 NOTE — Progress Notes (Signed)
    Attending Progress Note  History: Jared Boyd. is here for spondylolisthesis, neurogenic claudication, back pain with sciatica.  POD2: Continues to have pain that interferes with mobility.  Slow improvement. POD1: doing well.  Moderate back pain as expected.  Physical Exam: Vitals:   10/29/21 2243 10/29/21 2336  BP: 127/80 138/84  Pulse: 86 92  Resp: 16 20  Temp: 98.2 F (36.8 C) 98.3 F (36.8 C)  SpO2: 95% 94%    AA Ox3 CNI  Strength:5/5 throughout BLE Drain 45  Data:  Recent Labs  Lab 10/28/21 1208  NA 138  K 3.8  CL 109  CO2 21*  BUN 17  CREATININE 0.79  GLUCOSE 96  CALCIUM 9.4   No results for input(s): "AST", "ALT", "ALKPHOS" in the last 168 hours.  Invalid input(s): "TBILI"   Recent Labs  Lab 10/28/21 1208  WBC 8.1  HGB 16.4  HCT 49.0  PLT 201   No results for input(s): "APTT", "INR" in the last 168 hours.       Other tests/results: n/a  Assessment/Plan:  Jared Boyd. Is doing well post-op  - mobilize - pain control - DVT prophylaxis - PTOT today - watch drain - BM today   Meade Maw MD, [facility] Department of Neurosurgery

## 2021-10-30 NOTE — Progress Notes (Signed)
Occupational Therapy Treatment Patient Details Name: Rowan Blaker. MRN: [id number] DOB: 1955/11/15 Today's Date: 10/30/2021   History of present illness Walker Sitar. is a [name] comes to [facility] 10/28/21 for scheduled neurosurgery with Dr. Myer Haff for spondylolisthesis, neurogenic claudication, back pain with sciatica.  Pt underwent L3-4 POSTERIOR SPINAL DECOMPRESSION, L4-5 LATERAL LUMBAR INTERBODY FUSION WITH POSTERIOR SPINAL FIXATION.   OT comments  Mr. Fiorito was seen for OT follow up regarding safety and functional independence during ADL management in setting of recent lumbar surgery. Pt is able to return verbalize understanding of 4/4 back precautions, and requires min cueing for consistent implementation into ADL management. Pt much more functionally limited by pain this date. He requires close CGA during functional mobility and toilet transfer. He performs seated UB grooming with set up assist. Pt making good progress toward goals and continues to benefit from skilled OT services to maximize return to PLOF and minimize risk of future falls, injury, caregiver burden, and readmission. Will continue to follow POC. Discharge recommendation remains appropriate.     Recommendations for follow up therapy are one component of a multi-disciplinary discharge planning process, led by the attending physician.  Recommendations may be updated based on patient status, additional functional criteria and insurance authorization.    Follow Up Recommendations  No OT follow up    Assistance Recommended at Discharge Intermittent Supervision/Assistance  Patient can return home with the following  A little help with walking and/or transfers;A little help with bathing/dressing/bathroom;Assistance with cooking/housework;Assist for transportation;Help with stairs or ramp for entrance   Equipment Recommendations  BSC/3in1;Other (comment)    Recommendations for Other Services      Precautions /  Restrictions Precautions Precautions: Back Precaution Comments: BAT precautions Restrictions Weight Bearing Restrictions: No       Mobility Bed Mobility Overal bed mobility: Needs Assistance Bed Mobility: Supine to Sit     Supine to sit: Min guard, HOB elevated Sit to supine: Min assist   General bed mobility comments: educated in log roll    Transfers Overall transfer level: Needs assistance Equipment used: Rolling walker (2 wheels) Transfers: Sit to/from Stand Sit to Stand: Min guard           General transfer comment: initial VC for hand placement and RW mgt     Balance Overall balance assessment: Needs assistance Sitting-balance support: No upper extremity supported, Feet supported Sitting balance-Leahy Scale: Fair     Standing balance support: Bilateral upper extremity supported Standing balance-Leahy Scale: Fair Standing balance comment: Brief instance of posterior LOB during toilet transfer today. Pt albe to self-correct with min physical assist and cueing.                           ADL either performed or assessed with clinical judgement   ADL Overall ADL's : Needs assistance/impaired                                       General ADL Comments: CGA for toilet transfer and functional mobility. Pt endorsing increased pain this date. Able to perform UB grooming with SET UP assist while seated.    Extremity/Trunk Assessment              Vision       Perception     Praxis      Cognition Arousal/Alertness: Awake/alert Behavior During Therapy:  WFL for tasks assessed/performed Overall Cognitive Status: Within Functional Limits for tasks assessed                                          Exercises Other Exercises Other Exercises: OT facilitated toilet transfer and UB grooming tasks as described above. Pt provided with reinforcement of prior education on back precautions and how to maintain during ADL  management, safe use of AE/DME for ADL management, and back precautions.    Shoulder Instructions       General Comments      Pertinent Vitals/ Pain       Pain Assessment Pain Assessment: 0-10 Pain Score: 8  Pain Location: With mobility Pain Descriptors / Indicators: Aching, Sore, Grimacing, Guarding Pain Intervention(s): Limited activity within patient's tolerance, Monitored during session  Home Living                                          Prior Functioning/Environment              Frequency  Min 2X/week        Progress Toward Goals  OT Goals(current goals can now be found in the care plan section)  Progress towards OT goals: Progressing toward goals  Acute Rehab OT Goals Patient Stated Goal: To have less pain OT Goal Formulation: With patient Time For Goal Achievement: 11/12/21 Potential to Achieve Goals: Good  Plan Discharge plan remains appropriate;Frequency remains appropriate    Co-evaluation                 AM-PAC OT "6 Clicks" Daily Activity     Outcome Measure   Help from another person eating meals?: None Help from another person taking care of personal grooming?: None Help from another person toileting, which includes using toliet, bedpan, or urinal?: A Little Help from another person bathing (including washing, rinsing, drying)?: A Little Help from another person to put on and taking off regular upper body clothing?: None Help from another person to put on and taking off regular lower body clothing?: A Little 6 Click Score: 21    End of Session Equipment Utilized During Treatment: Gait belt;Rolling walker (2 wheels)  OT Visit Diagnosis: Other abnormalities of gait and mobility (R26.89);Muscle weakness (generalized) (M62.81);Pain Pain - Right/Left:  (both)   Activity Tolerance Patient limited by pain   Patient Left in chair;with call bell/phone within reach   Nurse Communication          Time:  5329-9242 OT Time Calculation (min): 25 min  Charges: OT General Charges $OT Visit: 1 Visit OT Treatments $Self Care/Home Management : 23-37 mins  Shara Blazing, M.S., OTR/L 10/30/21, 10:25 AM

## 2021-10-30 NOTE — Plan of Care (Signed)

## 2021-10-31 MED ORDER — OXYCODONE HCL 5 MG PO TABS: 5.0000 mg | ORAL_TABLET | ORAL | 0 refills | Status: DC | PRN

## 2021-10-31 MED ORDER — ACETAMINOPHEN 325 MG PO TABS: 650.0000 mg | ORAL_TABLET | ORAL | Status: DC | PRN

## 2021-10-31 MED ORDER — CELECOXIB 200 MG PO CAPS: 200.0000 mg | ORAL_CAPSULE | Freq: Two times a day (BID) | ORAL | 0 refills | Status: DC

## 2021-10-31 MED ORDER — LACTULOSE 10 GM/15ML PO SOLN
20.0000 g | Freq: Every day | ORAL | Status: DC
  Administered 2021-10-31: 20 g via ORAL
  Filled 2021-10-31: qty 30

## 2021-10-31 MED ORDER — METHOCARBAMOL 500 MG PO TABS: 500.0000 mg | ORAL_TABLET | Freq: Four times a day (QID) | ORAL | 0 refills | Status: DC | PRN

## 2021-10-31 MED ORDER — POLYETHYLENE GLYCOL 3350 17 G PO PACK: 17.0000 g | PACK | Freq: Every day | ORAL | 0 refills | Status: DC | PRN

## 2021-10-31 NOTE — Discharge Summary (Signed)
Physician Discharge Summary  Patient ID: Jared Boyd. MRN: [id number] DOB/AGE: [birth year] 66 y.o.  Admit date: 10/28/2021 Discharge date: 10/31/2021  Admission Diagnoses:Principal Problem:   Neurogenic claudication Active Problems:   Spondylolisthesis at L4-L5 level   Chronic bilateral low back pain with bilateral sciatica   Neurogenic claudication due to lumbar spinal stenosis  Discharge Diagnoses:  Principal Problem:   Neurogenic claudication Active Problems:   Spondylolisthesis at L4-L5 level   Chronic bilateral low back pain with bilateral sciatica   Neurogenic claudication due to lumbar spinal stenosis   Discharged Condition: good  Hospital Course: Jared Boyd presented for surgical intervention.  He started slow mobilization and was stable for discharge on POD3.  Consults: None  Significant Diagnostic Studies: radiology: X-Ray: correct placement  Treatments: surgery: L4-5 XLIF PSF with L3-4 decompression  Discharge Exam: Blood pressure (!) 146/91, pulse 80, temperature (!) 97.5 F (36.4 C), resp. rate 20, height 5\' 9"  (1.753 m), weight 92.3 kg, SpO2 96 %. General appearance: alert and cooperative  CNI MAEW 5/5  Disposition: Discharge disposition: 01-Home or Self Care       Discharge Instructions     Discharge patient   Complete by: As directed    Discharge disposition: 01-Home or Self Care   Discharge patient date: 10/31/2021   Incentive spirometry RT   Complete by: As directed       Allergies as of 10/31/2021   No Known Allergies      Medication List     TAKE these medications    acetaminophen 325 MG tablet Commonly known as: TYLENOL Take 2 tablets (650 mg total) by mouth every 4 (four) hours as needed for mild pain ((score 1 to 3) or temp > 100.5).   buPROPion 150 MG 12 hr tablet Commonly known as: WELLBUTRIN SR Take 150 mg by mouth 2 (two) times daily. 0500 & 1200   celecoxib 200 MG capsule Commonly known as: CELEBREX Take 1  capsule (200 mg total) by mouth every 12 (twelve) hours.   cetirizine 10 MG tablet Commonly known as: ZYRTEC Take 10 mg by mouth daily.   chlorpheniramine 4 MG tablet Commonly known as: CHLOR-TRIMETON Take 4 mg by mouth 2 (two) times daily. NOON & NIGHT.   COSAMIN DS PO Take 2 tablets by mouth daily. (1500mg -1200mg )   docusate sodium 100 MG capsule Commonly known as: COLACE Take 100 mg by mouth 3 (three) times daily.   DULoxetine 20 MG capsule Commonly known as: CYMBALTA Take 20 mg by mouth daily.   Fish Oil 1000 MG Caps Take 1,000 mg by mouth daily.   folic acid 1 MG tablet Commonly known as: FOLVITE Take 1 mg by mouth daily.   LORazepam 0.5 MG tablet Commonly known as: ATIVAN Take 0.5 tablets by mouth 3 (three) times daily.   methocarbamol 500 MG tablet Commonly known as: ROBAXIN Take 1 tablet (500 mg total) by mouth every 6 (six) hours as needed for muscle spasms.   omeprazole 20 MG capsule Commonly known as: PRILOSEC Take 20 mg by mouth daily.   oxyCODONE 5 MG immediate release tablet Commonly known as: Oxy IR/ROXICODONE Take 1 tablet (5 mg total) by mouth every 3 (three) hours as needed for moderate pain ((score 4 to 6)).   polyethylene glycol 17 g packet Commonly known as: MIRALAX / GLYCOLAX Take 17 g by mouth daily as needed (if no BM in 24 hours).   simvastatin 40 MG tablet Commonly known as: ZOCOR Take 40 mg by mouth  at bedtime.   tadalafil 5 MG tablet Commonly known as: CIALIS Take 5 mg by mouth daily.   triamcinolone 55 MCG/ACT Aero nasal inhaler Commonly known as: NASACORT Place 2 sprays into the nose daily.   Vitamin D3 50 MCG (2000 UT) Tabs Take 2,000 Units by mouth daily.   vitamin E 180 MG (400 UNITS) capsule Take 400 Units by mouth daily.               Durable Medical Equipment  (From admission, onward)           Start     Ordered   10/29/21 0958  For home use only DME Walker rolling  Once       Question Answer  Comment  Walker: With 5 Inch Wheels   Patient needs a walker to treat with the following condition Impaired mobility      10/29/21 0957   10/29/21 0958  For home use only DME Bedside commode  Once       Question:  Patient needs a bedside commode to treat with the following condition  Answer:  Impaired mobility   10/29/21 0957            Follow-up Information     Drake Leach, PA-C Follow up on 11/12/2021.   Specialty: Neurosurgery Why: 0830 Contact information: [address] [place] [address] [phone]                 Signed: Venetia Night 10/31/2021, 10:24 AM

## 2021-10-31 NOTE — Progress Notes (Signed)
    Attending Progress Note  History: Marvelle Caudill. is here for spondylolisthesis, neurogenic claudication, back pain with sciatica.  POD3: Needs BM.  Continues to have some pain. POD2: Continues to have pain that interferes with mobility.  Slow improvement. POD1: doing well.  Moderate back pain as expected.  Physical Exam: Vitals:   10/30/21 2328 10/31/21 0730  BP: 139/81 (!) 146/91  Pulse: 87 80  Resp: 20   Temp: 98.3 F (36.8 C) (!) 97.5 F (36.4 C)  SpO2: 95% 96%    AA Ox3 CNI  Strength:5/5 throughout BLE Drain removed  Data:  Recent Labs  Lab 10/28/21 1208  NA 138  K 3.8  CL 109  CO2 21*  BUN 17  CREATININE 0.79  GLUCOSE 96  CALCIUM 9.4   No results for input(s): "AST", "ALT", "ALKPHOS" in the last 168 hours.  Invalid input(s): "TBILI"   Recent Labs  Lab 10/28/21 1208  WBC 8.1  HGB 16.4  HCT 49.0  PLT 201   No results for input(s): "APTT", "INR" in the last 168 hours.       Other tests/results: n/a  Assessment/Plan:  [name]. Is doing well post-op  - mobilize - pain control - DVT prophylaxis - PTOT today - watch drain - BM today - enema   Meade Maw MD, [facility] Department of Neurosurgery

## 2021-10-31 NOTE — Care Management Important Message (Signed)
Important Message  Patient Details  Name: Jared Boyd. MRN: [id number] Date of Birth: 1955/10/30   Medicare Important Message Given:  N/A - LOS <3 / Initial given by admissions     Juliann Pulse A Chirstine Defrain 10/31/2021, 9:27 AM

## 2021-10-31 NOTE — Plan of Care (Signed)

## 2021-10-31 NOTE — Progress Notes (Signed)
Reviewed discharge instructions with pt. Spouse at bedside. Pt and spouse verbalized understanding. IV catheters intact when removed. Pt discharged with 3 in 1 and RW. Staff wheeled pt out. Pt transported to home via private vehicle.

## 2021-10-31 NOTE — Discharge Instructions (Signed)
Your surgeon has performed an operation on your lumbar spine (low back) to relieve pressure on one or more nerves. Many times, patients feel better immediately after surgery and can "overdo it." Even if you feel well, it is important that you follow these activity guidelines. If you do not let your back heal properly from the surgery, you can increase the chance of a disc herniation and/or return of your symptoms. The following are instructions to help in your recovery once you have been discharged from the hospital.  * It is ok to take celebrex but not other NSAIDs (ibuprofen and related medications) after surgery.  Activity    No bending, lifting, or twisting ("BLT"). Avoid lifting objects heavier than 10 pounds (gallon milk jug).  Where possible, avoid household activities that involve lifting, bending, pushing, or pulling such as laundry, vacuuming, grocery shopping, and childcare. Try to arrange for help from friends and family for these activities while your back heals.  Increase physical activity slowly as tolerated.  Taking short walks is encouraged, but avoid strenuous exercise. Do not jog, run, bicycle, lift weights, or participate in any other exercises unless specifically allowed by your doctor. Avoid prolonged sitting, including car rides.  Talk to your doctor before resuming sexual activity.  You should not drive until cleared by your doctor.  Until released by your doctor, you should not return to work or school.  You should rest at home and let your body heal.   You may shower two days after your surgery.  After showering, lightly dab your incision dry. Do not take a tub bath or go swimming for 3 weeks, or until approved by your doctor at your follow-up appointment.  If you smoke, we strongly recommend that you quit.  Smoking has been proven to interfere with normal healing in your back and will dramatically reduce the success rate of your surgery. Please contact [facility]  (800-QUIT-NOW) and use the resources at www.[facility].com for assistance in stopping smoking.  Surgical Incision   If you have a dressing on your incision, you may remove it three days after your surgery. Keep your incision area clean and dry.  If you have staples or stitches on your incision, you should have a follow up scheduled for removal. If you do not have staples or stitches, you will have steri-strips (small pieces of surgical tape) or Dermabond glue. The steri-strips/glue should begin to peel away within about a week (it is fine if the steri-strips fall off before then). If the strips are still in place one week after your surgery, you may gently remove them.  Diet            You may return to your usual diet. Be sure to stay hydrated.  When to Contact us  Although your surgery and recovery will likely be uneventful, you may have some residual numbness, aches, and pains in your back and/or legs. This is normal and should improve in the next few weeks.  However, should you experience any of the following, contact us immediately: New numbness or weakness Pain that is progressively getting worse, and is not relieved by your pain medications or rest Bleeding, redness, swelling, pain, or drainage from surgical incision Chills or flu-like symptoms Fever greater than 101.0 F (38.3 C) Problems with bowel or bladder functions Difficulty breathing or shortness of breath Warmth, tenderness, or swelling in your calf  Contact Information During office hours (Monday-Friday 9 am to 5 pm), please call your physician at [phone]  and ask for Berdine Addison After hours and weekends, please call [phone] and speak with the neurosurgeon on call For a life-threatening emergency, call 911

## 2021-11-04 ENCOUNTER — Telehealth: Payer: Self-pay

## 2021-11-04 NOTE — Telephone Encounter (Signed)
I spoke with Mr Jared Boyd and explained that there aren't any alternatives to celebrex, as Dr Izora Ribas doesn't permit any NSAIDS (except celebrex) for 3 months after fusions. I explained that if he is doing OK with just his pain medicine, muscle relaxer, and tylenol, he can skip the celebrex if he doesn't want to take it. He stated he may do that. I encouraged him to call us with any other questions/concerns.

## 2021-11-04 NOTE — Telephone Encounter (Signed)
-----   Message from Peggyann Shoals sent at 11/04/2021 11:55 AM EDT ----- Regarding: change medication Contact: [phone]  L3-4 PSD, L4-5 XLIF/PSF on 10/28/21 Can you prescribe something else besides the Celebrex since family members and friends have had reactions to it. He is afraid of taking it.  [place]

## 2021-11-08 NOTE — Progress Notes (Unsigned)
   REFERRING PHYSICIAN:  Maryland Pink, Md [address] [place],  [place] [address]  DOS: 10/28/21  L4-5 XLIF PSF with L3-4 decompression  HISTORY OF PRESENT ILLNESS: Jared Rio. is approximately 2 weeks status post  L4-5 XLIF PSF with L3-4 decompression. Was given celebrex, robaxin, and oxycodone on discharge from the hospital.   He has not taken much oxycodone. He is taking tylneol and prn robaxin. He feels like his pain is getting better everyday. His preop numbness is gone.   Had some initial right hip/groin pain after surgery, but this has resolved.    PHYSICAL EXAMINATION:  General: Patient is well developed, well nourished, calm, collected, and in no apparent distress.   NEUROLOGICAL:  General: In no acute distress.   Awake, alert, oriented to person, place, and time.  Pupils equal round and reactive to light.  Facial tone is symmetric.     Strength:            Side Iliopsoas Quads Hamstring PF DF EHL  R 5 5 5 5 5 5   L 5 5 5 5 5 5    Incisions c/d/i   ROS (Neurologic):  Negative except as noted above  IMAGING: Nothing new to review.   ASSESSMENT/PLAN:  Jared Rio. is doing well s/p above surgery. Treatment options reviewed with patient and following plan made:   - I have advised the patient to lift up to 10 pounds until 6 weeks after surgery (follow up with Dr. Izora Ribas).  - Reviewed wound care.  - No bending, twisting, or lifting.  - Continue on current medications including tylenol and prn robaxin. He cannot take celebrex.  - Follow up as scheduled in 4 weeks and prn.   Advised to contact the office if any questions or concerns arise.  Geronimo Boot PA-C Department of neurosurgery

## 2021-11-12 ENCOUNTER — Ambulatory Visit (INDEPENDENT_AMBULATORY_CARE_PROVIDER_SITE_OTHER): Payer: Medicare HMO | Admitting: Orthopedic Surgery

## 2021-11-12 ENCOUNTER — Encounter: Payer: Self-pay | Admitting: Orthopedic Surgery

## 2021-11-12 VITALS — BP 130/78 | Temp 97.9°F | Ht 69.0 in | Wt 203.0 lb

## 2021-11-12 DIAGNOSIS — Z981 Arthrodesis status: Secondary | ICD-10-CM

## 2021-11-12 DIAGNOSIS — M4316 Spondylolisthesis, lumbar region: Secondary | ICD-10-CM

## 2021-11-12 DIAGNOSIS — M48062 Spinal stenosis, lumbar region with neurogenic claudication: Secondary | ICD-10-CM

## 2021-11-28 ENCOUNTER — Telehealth: Payer: Self-pay

## 2021-11-28 NOTE — Telephone Encounter (Signed)
I spoke with Jared Boyd. I informed him that it is OK to sleep in the recliner and it is OK to use heat and/or ice. He reports he hasn't taken any pain medication in a couple of weeks. He tried methocarbamol, but it didn't make a difference. I offered to ask Dr Izora Ribas about a different muscle relaxer, but he declined at this time. He will follow up as scheduled on 12/10/21, but will call if he needs anything in the meantime.

## 2021-11-28 NOTE — Telephone Encounter (Signed)
-----   Message from Peggyann Shoals sent at 11/28/2021  1:44 PM EDT ----- Regarding: postop question Contact: [phone] L3-4 PSD, L4-5 XLIF/PSF 10/28/2021 Patient is still feeling some soreness and that is managed with tylenol.  Is it ok for him to continue to use ice or should he be using heat also. Is it ok for the patient to sit and sleep in his recliner? Some days he sleeps in his bed but he wants to make sure that if he sleeps in his recliner it won't affect his progress in healing.

## 2021-12-09 ENCOUNTER — Other Ambulatory Visit: Payer: Self-pay

## 2021-12-09 DIAGNOSIS — Z981 Arthrodesis status: Secondary | ICD-10-CM

## 2021-12-10 ENCOUNTER — Ambulatory Visit
Admission: RE | Admit: 2021-12-10 | Discharge: 2021-12-10 | Disposition: A | Discharge status: Home / Self Care | Payer: Medicare HMO | Source: Ambulatory Visit | Attending: Neurosurgery | Admitting: Neurosurgery

## 2021-12-10 ENCOUNTER — Ambulatory Visit (INDEPENDENT_AMBULATORY_CARE_PROVIDER_SITE_OTHER): Payer: Medicare HMO | Admitting: Neurosurgery

## 2021-12-10 ENCOUNTER — Ambulatory Visit
Admission: RE | Admit: 2021-12-10 | Discharge: 2021-12-10 | Disposition: A | Discharge status: Home / Self Care | Payer: Medicare HMO | Attending: Neurosurgery | Admitting: Neurosurgery

## 2021-12-10 DIAGNOSIS — Z981 Arthrodesis status: Secondary | ICD-10-CM

## 2021-12-10 DIAGNOSIS — Z09 Encounter for follow-up examination after completed treatment for conditions other than malignant neoplasm: Secondary | ICD-10-CM

## 2021-12-10 DIAGNOSIS — Z01818 Encounter for other preprocedural examination: Secondary | ICD-10-CM | POA: Diagnosis not present

## 2021-12-10 DIAGNOSIS — M4316 Spondylolisthesis, lumbar region: Secondary | ICD-10-CM

## 2021-12-10 NOTE — Progress Notes (Signed)
   REFERRING PHYSICIAN:  Jerl Mina, Md [address] [place],  [place] [address]  DOS: 10/28/21  L4-5 XLIF PSF with L3-4 decompression  HISTORY OF PRESENT ILLNESS: Lilian Kapur. is  status post  L4-5 XLIF PSF with L3-4 decompression.   He is doing much better compared to prior to surgery.  He has surgical discomfort at times.    PHYSICAL EXAMINATION:  Telephone visit  IMAGING: No complications  ASSESSMENT/PLAN:  Lilian Kapur. is doing well s/p above surgery.   This visit was performed via telephone.  [facility] Provider location: office  I spent a total of 5 minutes non-face-to-face activities for this visit on the date of this encounter including review of current clinical condition and response to treatment.  Venetia Night Department of neurosurgery

## 2022-01-06 DIAGNOSIS — J439 Emphysema, unspecified: Secondary | ICD-10-CM | POA: Diagnosis not present

## 2022-01-06 DIAGNOSIS — R053 Chronic cough: Secondary | ICD-10-CM | POA: Diagnosis not present

## 2022-01-06 DIAGNOSIS — R0602 Shortness of breath: Secondary | ICD-10-CM | POA: Diagnosis not present

## 2022-01-10 ENCOUNTER — Other Ambulatory Visit: Payer: Self-pay

## 2022-01-10 DIAGNOSIS — Z981 Arthrodesis status: Secondary | ICD-10-CM

## 2022-01-14 ENCOUNTER — Encounter: Payer: Self-pay | Admitting: Neurosurgery

## 2022-01-14 ENCOUNTER — Ambulatory Visit
Admission: RE | Admit: 2022-01-14 | Discharge: 2022-01-14 | Disposition: A | Discharge status: Home / Self Care | Payer: Medicare HMO | Source: Ambulatory Visit | Attending: Neurosurgery | Admitting: Neurosurgery

## 2022-01-14 ENCOUNTER — Ambulatory Visit (INDEPENDENT_AMBULATORY_CARE_PROVIDER_SITE_OTHER): Payer: Medicare HMO | Admitting: Neurosurgery

## 2022-01-14 ENCOUNTER — Ambulatory Visit
Admission: RE | Admit: 2022-01-14 | Discharge: 2022-01-14 | Disposition: A | Discharge status: Home / Self Care | Payer: Medicare HMO | Attending: Neurosurgery | Admitting: Neurosurgery

## 2022-01-14 VITALS — BP 130/80 | Ht 69.0 in | Wt 201.4 lb

## 2022-01-14 DIAGNOSIS — Z09 Encounter for follow-up examination after completed treatment for conditions other than malignant neoplasm: Secondary | ICD-10-CM

## 2022-01-14 DIAGNOSIS — Z981 Arthrodesis status: Secondary | ICD-10-CM

## 2022-01-14 DIAGNOSIS — M4316 Spondylolisthesis, lumbar region: Secondary | ICD-10-CM

## 2022-01-14 NOTE — Progress Notes (Signed)
   REFERRING PHYSICIAN:  Jerl Mina, Md [address] [place],  [place] [address]  DOS: 10/28/21  L4-5 XLIF PSF with L3-4 decompression  HISTORY OF PRESENT ILLNESS: Lilian Kapur. is  status post  L4-5 XLIF PSF with L3-4 decompression.   He is doing much better compared to prior to surgery.  He has minimal discomfort.      PHYSICAL EXAMINATION:  Vitals:   01/14/22 1103  BP: 130/80    5/5  throughout bilateral lower extremities  IMAGING: No complications noted  ASSESSMENT/PLAN:  Lilian Kapur. is doing well s/p above surgery.  I will see him back in 6 months with x-rays.  He is now off activity limitations.  We did discuss that he should wait until 3 months postop to utilize firearms.    Venetia Night Department of neurosurgery

## 2022-07-16 ENCOUNTER — Other Ambulatory Visit: Payer: Self-pay

## 2022-07-16 DIAGNOSIS — M4316 Spondylolisthesis, lumbar region: Secondary | ICD-10-CM

## 2022-07-17 ENCOUNTER — Ambulatory Visit: Payer: Medicare HMO | Admitting: Neurosurgery

## 2022-10-23 DIAGNOSIS — Z125 Encounter for screening for malignant neoplasm of prostate: Secondary | ICD-10-CM | POA: Diagnosis not present

## 2022-10-23 DIAGNOSIS — G9009 Other idiopathic peripheral autonomic neuropathy: Secondary | ICD-10-CM | POA: Diagnosis not present

## 2022-10-23 DIAGNOSIS — Z0001 Encounter for general adult medical examination with abnormal findings: Secondary | ICD-10-CM | POA: Diagnosis not present

## 2022-10-23 DIAGNOSIS — Z0000 Encounter for general adult medical examination without abnormal findings: Secondary | ICD-10-CM | POA: Diagnosis not present

## 2022-10-28 DIAGNOSIS — Z1331 Encounter for screening for depression: Secondary | ICD-10-CM | POA: Diagnosis not present

## 2022-10-28 DIAGNOSIS — J439 Emphysema, unspecified: Secondary | ICD-10-CM | POA: Diagnosis not present

## 2022-10-28 DIAGNOSIS — Z23 Encounter for immunization: Secondary | ICD-10-CM | POA: Diagnosis not present

## 2022-10-28 DIAGNOSIS — E785 Hyperlipidemia, unspecified: Secondary | ICD-10-CM | POA: Diagnosis not present

## 2022-10-28 DIAGNOSIS — F3340 Major depressive disorder, recurrent, in remission, unspecified: Secondary | ICD-10-CM | POA: Diagnosis not present

## 2022-10-28 DIAGNOSIS — Z0000 Encounter for general adult medical examination without abnormal findings: Secondary | ICD-10-CM | POA: Diagnosis not present

## 2022-10-28 DIAGNOSIS — M5416 Radiculopathy, lumbar region: Secondary | ICD-10-CM | POA: Diagnosis not present

## 2022-10-28 DIAGNOSIS — R7989 Other specified abnormal findings of blood chemistry: Secondary | ICD-10-CM | POA: Diagnosis not present

## 2022-10-28 DIAGNOSIS — F419 Anxiety disorder, unspecified: Secondary | ICD-10-CM | POA: Diagnosis not present

## 2022-12-09 DIAGNOSIS — M5416 Radiculopathy, lumbar region: Secondary | ICD-10-CM | POA: Diagnosis not present

## 2022-12-23 ENCOUNTER — Encounter: Payer: Self-pay | Admitting: Neurosurgery

## 2022-12-23 ENCOUNTER — Ambulatory Visit: Payer: Medicare HMO | Admitting: Neurosurgery

## 2022-12-23 VITALS — BP 136/84 | Ht 69.0 in | Wt 202.0 lb

## 2022-12-23 DIAGNOSIS — Z09 Encounter for follow-up examination after completed treatment for conditions other than malignant neoplasm: Secondary | ICD-10-CM | POA: Diagnosis not present

## 2022-12-23 DIAGNOSIS — M4316 Spondylolisthesis, lumbar region: Secondary | ICD-10-CM | POA: Diagnosis not present

## 2022-12-23 NOTE — Progress Notes (Signed)
   REFERRING PHYSICIAN:  Jerl Mina, Md [address] [place],  [place] [address]  DOS: 10/28/21  L4-5 XLIF PSF with L3-4 decompression  HISTORY OF PRESENT ILLNESS: Jared Boyd. is  status post  L4-5 XLIF PSF with L3-4 decompression.   He is doing much better compared to prior to surgery.  He has minimal discomfort.    He does have some cramping in his legs at nighttime.  He notices that he does have some pain when he does things he should not do.   PHYSICAL EXAMINATION:  Vitals:   12/23/22 0947  BP: 136/84     5/5  throughout bilateral lower extremities  IMAGING: No complications noted  ASSESSMENT/PLAN:  Jared Boyd. is doing well s/p above surgery.  I will see him back as needed, or if his cramping and leg symptoms worsen he will let me know.  Will then consider working up his symptoms at that time.  I did recommend that he consider using naproxen 2 pills twice a day if he is allowed to do so from a medical standpoint.   I spent a total of 10 minutes in this patient's care today. This time was spent reviewing pertinent records including imaging studies, obtaining and confirming history, performing a directed evaluation, formulating and discussing my recommendations, and documenting the visit within the medical record.     Venetia Night Department of neurosurgery

## 2023-01-05 DIAGNOSIS — J029 Acute pharyngitis, unspecified: Secondary | ICD-10-CM | POA: Diagnosis not present

## 2023-01-05 DIAGNOSIS — B370 Candidal stomatitis: Secondary | ICD-10-CM | POA: Diagnosis not present

## 2023-03-25 ENCOUNTER — Ambulatory Visit: Payer: Medicare Other

## 2023-03-25 DIAGNOSIS — K2950 Unspecified chronic gastritis without bleeding: Secondary | ICD-10-CM | POA: Diagnosis present

## 2023-08-24 ENCOUNTER — Other Ambulatory Visit: Payer: Self-pay | Admitting: Gastroenterology

## 2023-08-24 DIAGNOSIS — R7401 Elevation of levels of liver transaminase levels: Secondary | ICD-10-CM

## 2023-08-27 ENCOUNTER — Ambulatory Visit
Admission: RE | Admit: 2023-08-27 | Discharge: 2023-08-27 | Disposition: A | Discharge status: Home / Self Care | Source: Ambulatory Visit | Attending: Gastroenterology | Admitting: Gastroenterology

## 2023-08-27 DIAGNOSIS — R7401 Elevation of levels of liver transaminase levels: Secondary | ICD-10-CM | POA: Insufficient documentation

## 2023-08-27 MED ORDER — IOHEXOL 300 MG/ML  SOLN
100.0000 mL | Freq: Once | INTRAMUSCULAR | Status: AC | PRN
  Administered 2023-08-27: 100 mL via INTRAVENOUS
